# Patient Record
Sex: Male | Born: 1975 | Race: White | Hispanic: No | Marital: Married | Smoking: Never smoker
Health system: Southern US, Community
[De-identification: ages and names within clinical notes are randomized; demographics above are authoritative.]

## PROBLEM LIST (undated history)

## (undated) ENCOUNTER — Ambulatory Visit (HOSPITAL_COMMUNITY): Source: Home / Self Care

## (undated) DIAGNOSIS — B009 Herpesviral infection, unspecified: Secondary | ICD-10-CM

## (undated) DIAGNOSIS — M545 Low back pain, unspecified: Secondary | ICD-10-CM

## (undated) DIAGNOSIS — S82899A Other fracture of unspecified lower leg, initial encounter for closed fracture: Secondary | ICD-10-CM

## (undated) HISTORY — DX: Low back pain, unspecified: M54.50

## (undated) HISTORY — DX: Low back pain: M54.5

## (undated) HISTORY — DX: Herpesviral infection, unspecified: B00.9

## (undated) HISTORY — DX: Other fracture of unspecified lower leg, initial encounter for closed fracture: S82.899A

---

## 2005-07-13 ENCOUNTER — Encounter: Admission: RE | Admit: 2005-07-13 | Discharge: 2005-07-13 | Payer: Self-pay | Admitting: Family Medicine

## 2005-10-14 DIAGNOSIS — B009 Herpesviral infection, unspecified: Secondary | ICD-10-CM

## 2005-10-14 HISTORY — DX: Herpesviral infection, unspecified: B00.9

## 2009-03-04 ENCOUNTER — Emergency Department (HOSPITAL_COMMUNITY): Admission: EM | Admit: 2009-03-04 | Discharge: 2009-03-04 | Payer: Self-pay | Admitting: Emergency Medicine

## 2010-09-21 IMAGING — CR DG KNEE COMPLETE 4+V*R*
4 series · 4 of 4 positions shown · non-contrast
Comparison: None.

CLINICAL DATA: Fall, ankle fracture, knee pain

RIGHT KNEE - COMPLETE 4+ VIEW

[t knee ap right]
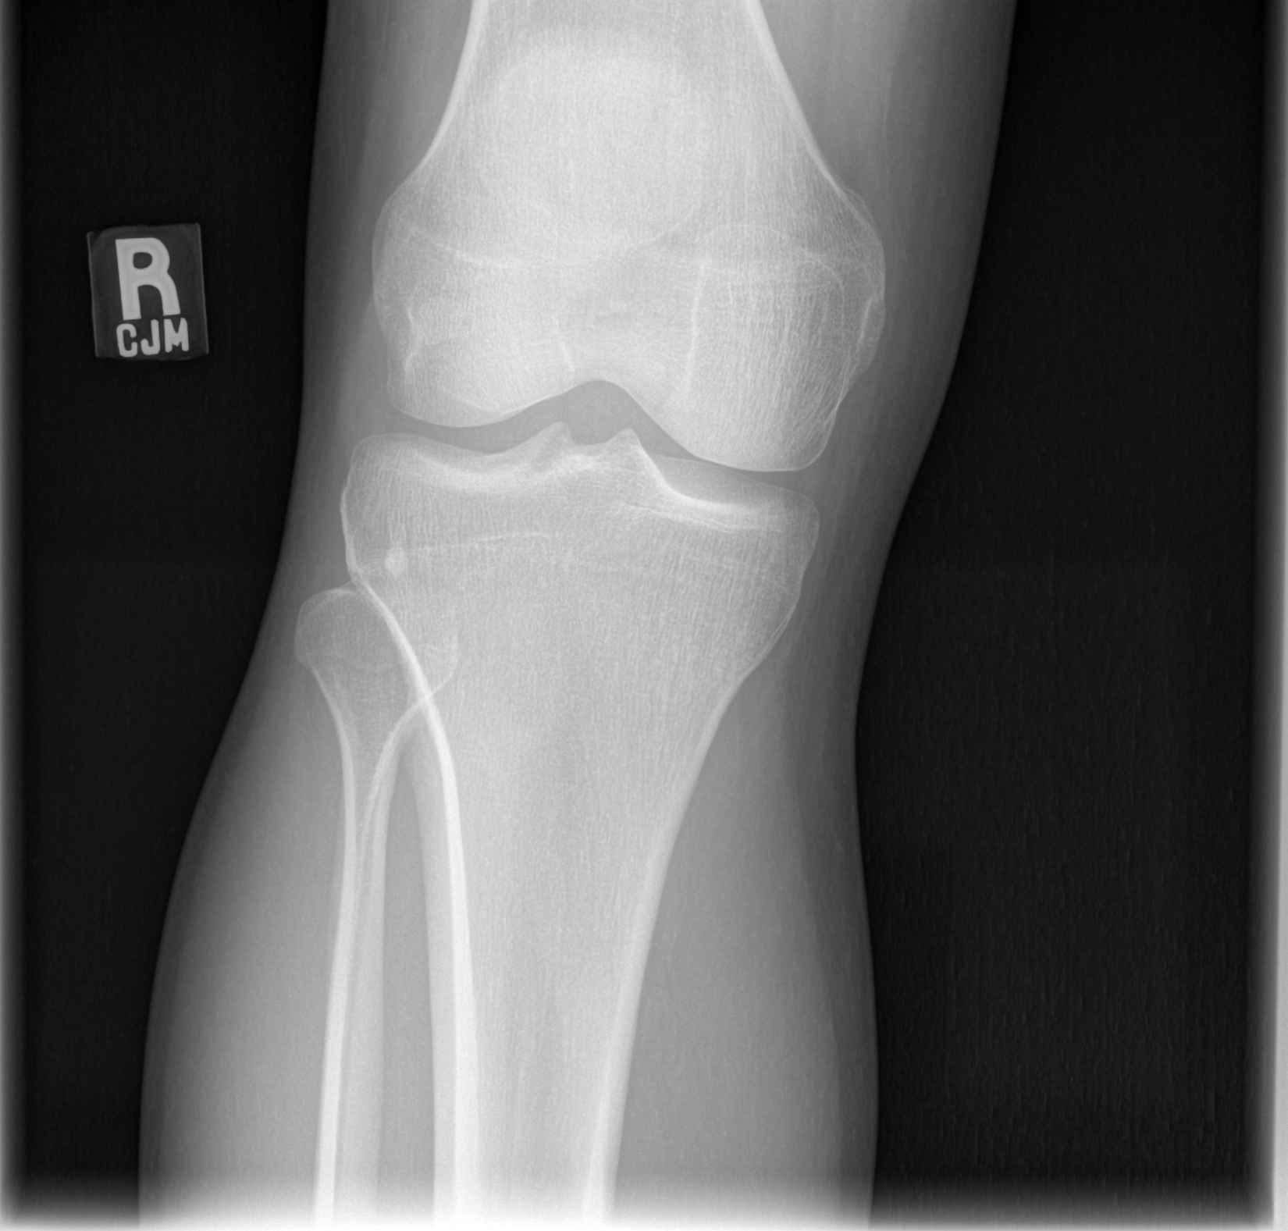

[t knee oblique right (1 of 2)]
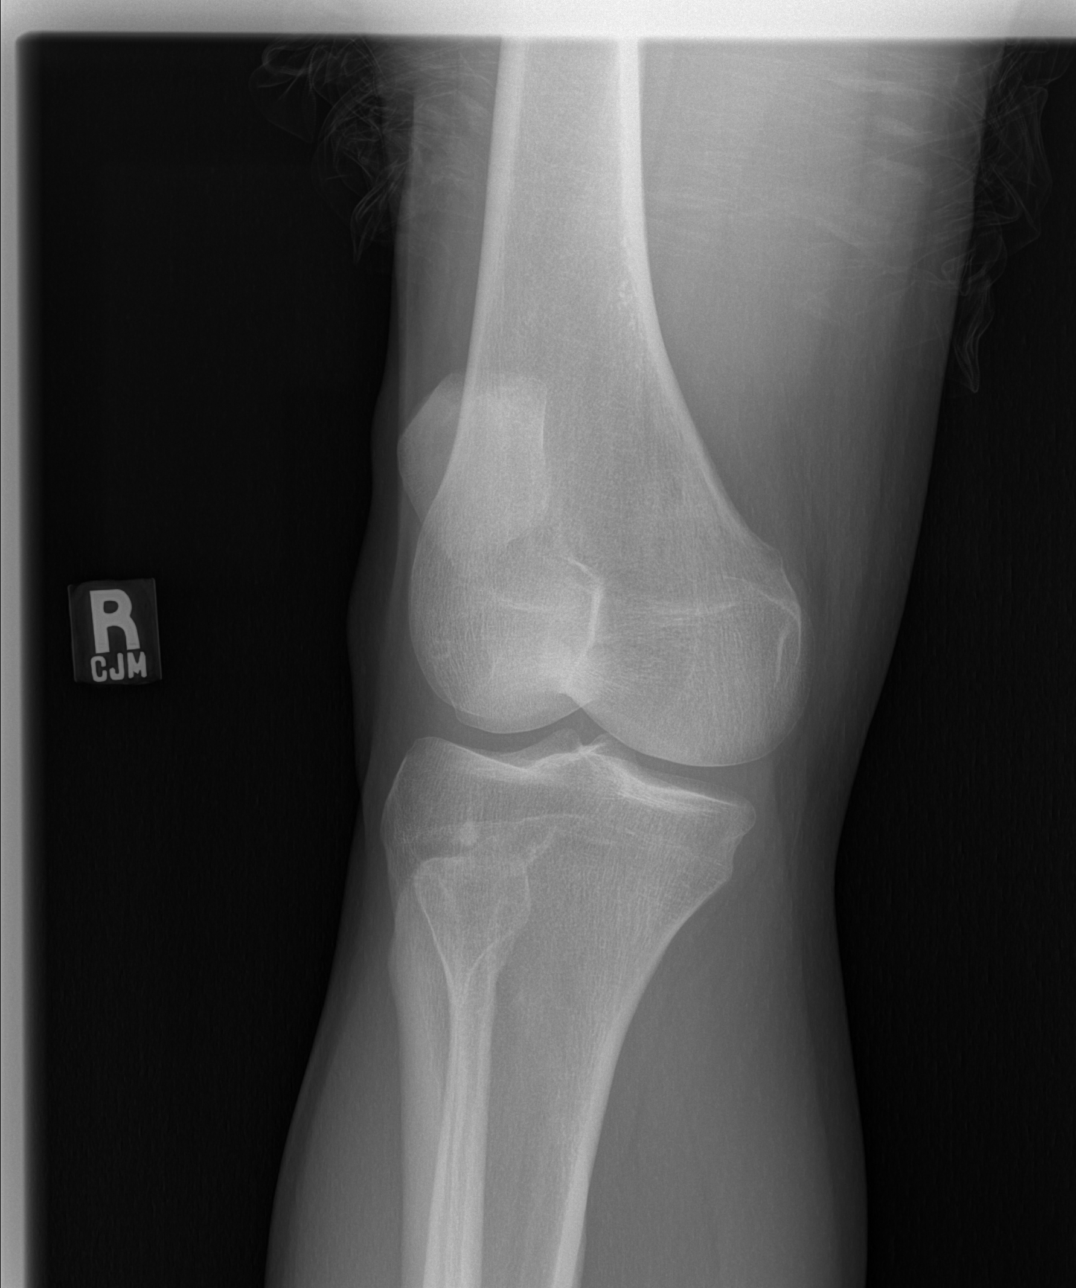

[t knee oblique right (2 of 2)]
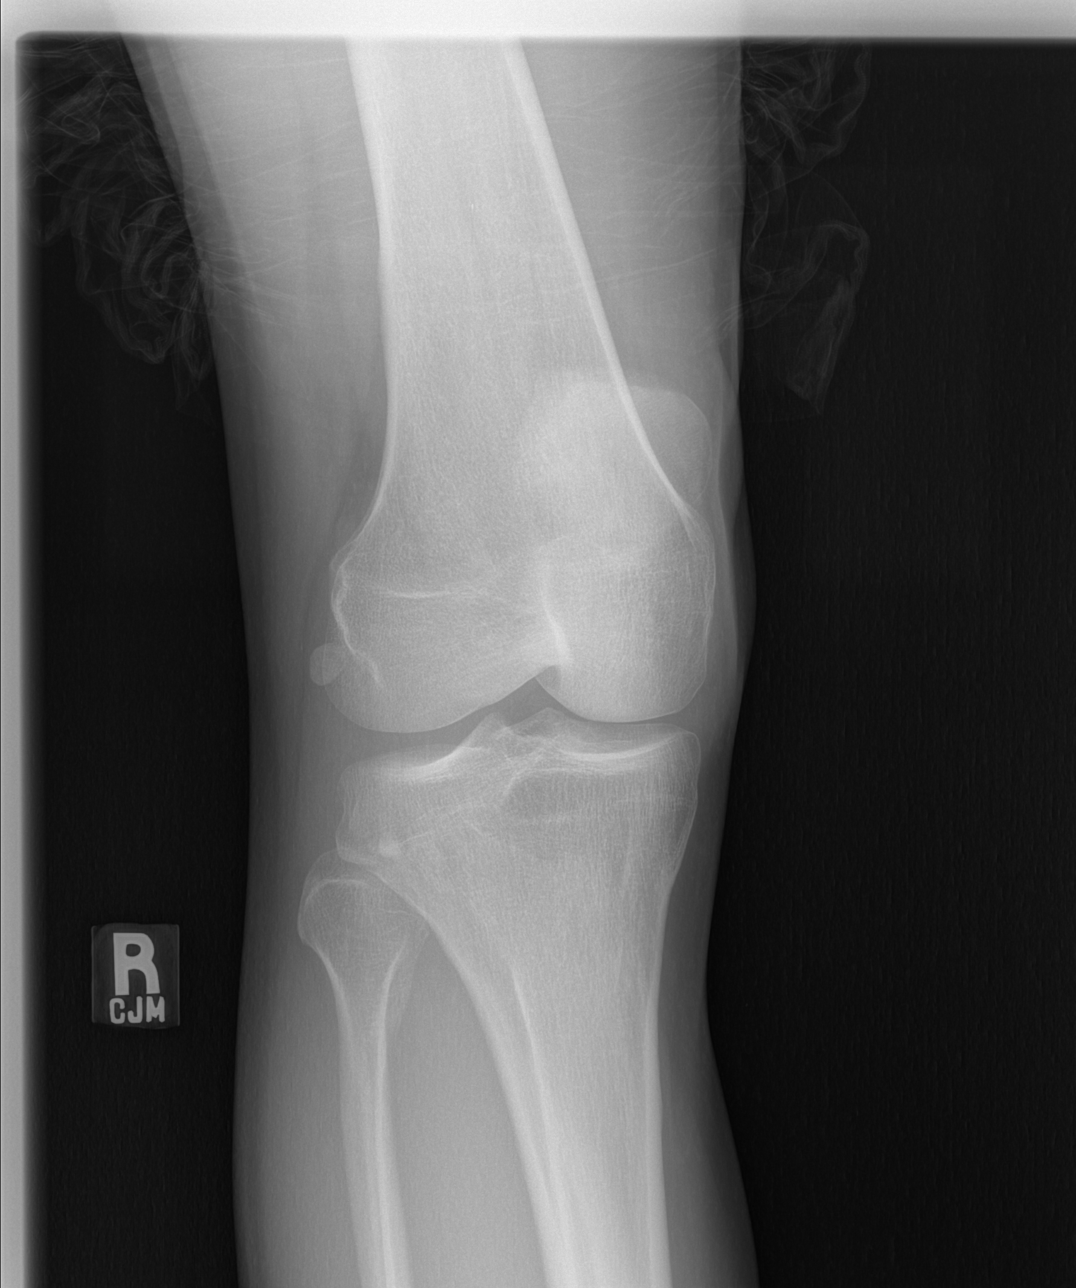

[t knee lat right]
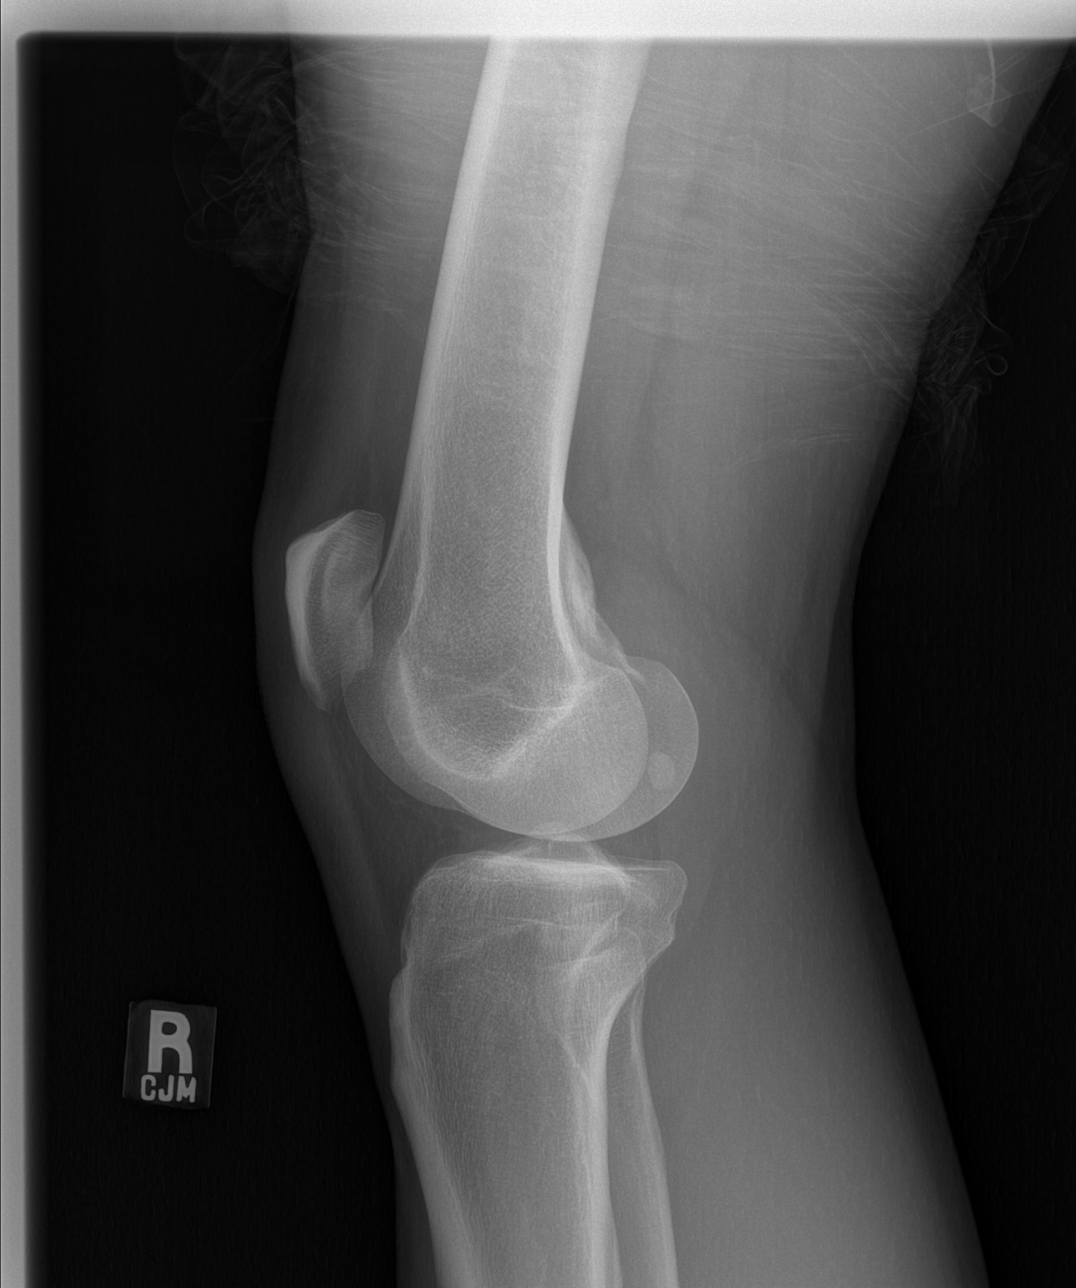

[4 of 4 positions shown; findings below may reference images not displayed]

FINDINGS: Normal alignment.  No displaced fracture or large joint
effusion.  Preserved joint spaces.  No arthritic change.  Small
sclerotic bone island in the right lateral tibia plateau.
IMPRESSION: No acute bony abnormality.

## 2010-10-26 ENCOUNTER — Ambulatory Visit (INDEPENDENT_AMBULATORY_CARE_PROVIDER_SITE_OTHER): Payer: 59 | Admitting: Physician Assistant

## 2010-10-26 DIAGNOSIS — M545 Low back pain, unspecified: Secondary | ICD-10-CM

## 2010-10-26 DIAGNOSIS — T148XXA Other injury of unspecified body region, initial encounter: Secondary | ICD-10-CM

## 2010-12-10 ENCOUNTER — Ambulatory Visit (INDEPENDENT_AMBULATORY_CARE_PROVIDER_SITE_OTHER): Payer: 59 | Admitting: Family Medicine

## 2010-12-10 DIAGNOSIS — J069 Acute upper respiratory infection, unspecified: Secondary | ICD-10-CM

## 2011-02-01 ENCOUNTER — Other Ambulatory Visit: Payer: Self-pay | Admitting: Occupational Medicine

## 2011-02-01 ENCOUNTER — Ambulatory Visit
Admission: RE | Admit: 2011-02-01 | Discharge: 2011-02-01 | Disposition: A | Discharge status: Home / Self Care | Payer: 59 | Source: Ambulatory Visit | Attending: Occupational Medicine | Admitting: Occupational Medicine

## 2011-02-01 DIAGNOSIS — M79673 Pain in unspecified foot: Secondary | ICD-10-CM

## 2011-02-24 ENCOUNTER — Encounter: Payer: Self-pay | Admitting: *Deleted

## 2011-03-01 ENCOUNTER — Ambulatory Visit (INDEPENDENT_AMBULATORY_CARE_PROVIDER_SITE_OTHER): Payer: 59 | Admitting: Family Medicine

## 2011-03-01 ENCOUNTER — Encounter: Payer: Self-pay | Admitting: Family Medicine

## 2011-03-01 VITALS — BP 134/70 | HR 80 | Ht 70.0 in | Wt 184.0 lb

## 2011-03-01 DIAGNOSIS — M79609 Pain in unspecified limb: Secondary | ICD-10-CM

## 2011-03-01 DIAGNOSIS — M79672 Pain in left foot: Secondary | ICD-10-CM

## 2011-03-01 NOTE — Progress Notes (Signed)
  Subjective:    Patient ID: James Bond, male    DOB: Jan 14, 1976, 35 y.o.   MRN: [id number]  HPI On May 20th, while training, injured arch of left foot.  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  Treated with hard-soled shoe and anti-inflammatories.  Called last week to make appointment because he was having some ongoing discomfort. He had been cleared to go back to work, but wanted my opinion regarding his activities.  He plans to run the [employer]	[place] marathon in October.  Complaining of stiffness upon awakening in the morning (arch/bottom of foot), occasionally pain spreads to the top of the foot.  Denies heel pain.  Seems to work itself out, and get better as the day progresses.  Some soreness persists/develops if on his feet all day and very active.  Symptoms were worse last week when he called to make the appointment.  He has been resting it more, hasn't been running, but has been walking 3-5 miles every evening and not having increased pain   Review of Systems No numbness, tingling, swelling, rash, fever, GI complaints or other concerns    Objective:   Physical Exam BP 134/70  Pulse 80  Ht 5\' 10"  (1.778 m)  Wt 184 lb (83.462 kg)  BMI 26.40 kg/m2 Pleasant, well developed male in no distress Extremities:  L foot--2+ pulses.  No clubbing or cyanosis, no edema.  Area of his discomfort is in his arch. He is not tender anywhere along the arch/plantar fascia or heel.  No reproducible tenderness at all       Assessment & Plan:   1. Foot pain, left    It sounds as though he strained his arch/plantar fascia, but that with light duty and rest, symptoms have gradually improved. Currently his exam is entirely normal.  He was instructed on stretching exercises for the arch (recommend trying golfball).  Gradually increase activity next week, starting with short runs, and increasing as tolerated.  If he has recurrent pain, back down on activities, prn NSAID, ice  after activity.  F/U prn

## 2011-07-29 ENCOUNTER — Ambulatory Visit (INDEPENDENT_AMBULATORY_CARE_PROVIDER_SITE_OTHER): Payer: 59 | Admitting: Family Medicine

## 2011-07-29 ENCOUNTER — Encounter: Payer: Self-pay | Admitting: Family Medicine

## 2011-07-29 VITALS — BP 108/70 | HR 64 | Ht 70.0 in | Wt 184.0 lb

## 2011-07-29 DIAGNOSIS — L259 Unspecified contact dermatitis, unspecified cause: Secondary | ICD-10-CM

## 2011-07-29 DIAGNOSIS — Z23 Encounter for immunization: Secondary | ICD-10-CM

## 2011-07-29 DIAGNOSIS — L309 Dermatitis, unspecified: Secondary | ICD-10-CM

## 2011-07-29 DIAGNOSIS — M7650 Patellar tendinitis, unspecified knee: Secondary | ICD-10-CM

## 2011-07-29 MED ORDER — TRIAMCINOLONE ACETONIDE 0.1 % EX CREA: TOPICAL_CREAM | Freq: Two times a day (BID) | CUTANEOUS | Status: AC

## 2011-07-29 MED ORDER — DICLOFENAC SODIUM 75 MG PO TBEC: 75.0000 mg | DELAYED_RELEASE_TABLET | Freq: Two times a day (BID) | ORAL | Status: DC

## 2011-07-29 NOTE — Patient Instructions (Signed)
Use steroid cream twice daily, sparingly, to affected areas of skin.  If the rash on neck is getting worse or spreading, STOP using it, and change to lotrimin cream.  You may need to use this twice daily for up to 2-3 weeks  Patellar Tendinitis, Jumper's Knee with Rehab Tendinitis is inflammation of a tendon. Tendonitis of the tendon below the kneecap (patella) is known as patellar tendonitis. Patellar tendonitis is a common cause of pain below the kneecap (infrapatella). Patellar tendonitis may involve a tear (strain) in the ligament. Strains are classified into three categories. Grade 1 strains cause pain, but the tendon is not lengthened. Grade 2 strains include a lengthened ligament, due to the ligament being stretched or partially ruptured. With grade 2 strains there is still function, although function may be decreased. Grade 3 strains involve a complete tear of the tendon or muscle, and function is usually impaired. Patellar tendon strains are usually grade 1 or 2.  SYMPTOMS   Pain, tenderness, swelling, warmth, or redness over the patellar tendon (just below the kneecap).   Pain and loss of strength (sometimes), with forcefully straightening the knee (especially when jumping or rising from a seated or squatting position), or bending the knee completely (squatting or kneeling).   Crackling sound (crepitation) when the tendon is moved or touched.  CAUSES  Patellar tendonitis is caused by injury to the patellar tendon. The inflammation is the body's healing response. Common causes of injury include:  Stress from a sudden increase in intensity, frequency, or duration of training.   Overuse of the quadriceps thigh muscles and patellar tendon.   Direct hit (trauma) to the knee or patellar tendon.  RISK INCREASES WITH:  Sports that require sudden, explosive thigh muscle (quadriceps) contraction, such as jumping, quick starts, or kicking.   Running sports, especially running down hills.    Poor strength and flexibility of the thigh and knee.   Flat feet.  PREVENTION  Warm up and stretch properly before activity.   Allow for adequate recovery between workouts.   Maintain physical fitness:   Strength, flexibility, and endurance.   Cardiovascular fitness.   Protect the knee joint with taping, protective strapping, bracing, or elastic compression bandage.   Wear arch supports (orthotics).  PROGNOSIS  If treated properly, patellar tendonitis usually heals within 6 weeks.  RELATED COMPLICATIONS   Longer healing time, if not properly treated or if not given enough time to heal.   Recurring symptoms, if activity is resumed too soon, with overuse, with a direct blow, or when using poor technique.   If untreated, tendon rupture requiring surgery.  TREATMENT Treatment first involves the use of ice and medicine, to reduce pain and inflammation. The use of strengthening and stretching exercises may help reduce pain with activity. These exercises may be performed at home or with a therapist. Serious cases of tendonitis may require restraining the knee for 10 to 14 days, to prevent stress on the tendon and to promote healing. Crutches may be used (uncommon) until you can walk without a limp. For cases in which non-surgical treatment is unsuccessful, surgery may be advised, to remove the inflamed tendon lining (sheath). Surgery is rare, and is only advised after at least 6 months of non-surgical treatment. MEDICATION   If pain medicine is needed, nonsteroidal anti-inflammatory medicines (aspirin and ibuprofen), or other minor pain relievers (acetaminophen), are often advised.   Do not take pain medicine for 7 days before surgery.   Prescription pain relievers may be given,  if your caregiver thinks they are needed. Use only as directed and only as much as you need.  HEAT AND COLD  Cold treatment (icing) should be applied for 10 to 15 minutes every 2 to 3 hours for inflammation  and pain, and immediately after activity that aggravates your symptoms. Use ice packs or an ice massage.   Heat treatment may be used before performing stretching and strengthening activities prescribed by your caregiver, physical therapist, or athletic trainer. Use a heat pack or a warm water soak.  SEEK MEDICAL CARE IF:  Symptoms get worse or do not improve in 2 weeks, despite treatment.   New, unexplained symptoms develop. (Drugs used in treatment may produce side effects.)  EXERCISES RANGE OF MOTION (ROM) AND STRETCHING EXERCISES - Patellar Tendinitis (Jumper's Knee) These are some of the initial exercises with which you may start your rehabilitation program, until you see your caregiver again or until your symptoms are resolved. Remember:   Flexible tissue is more tolerant of the stresses placed on it during activities.   Each stretch should be held for 20 to 30 seconds.   A gentle stretching sensation should be felt.  STRETCH - Hamstrings, Supine  Lie on your back. Loop a belt or towel over the ball of your right / left foot.   Straighten your right / left knee and slowly pull on the belt to raise your leg. Do not allow the right / left knee to bend. Keep your opposite leg flat on the floor.   Raise the leg until you feel a gentle stretch behind your right / left knee or thigh. Hold this position for __________ seconds.  Repeat __________ times. Complete this stretch __________ times per day.  STRETCH - Hamstrings, Doorway  Lie on your back with your right / left leg extended and resting on the wall, and the opposite leg flat on the ground through the door. At first, position your bottom farther away from the wall.   Keep your right / left knee straight. If you feel a stretch behind your knee or thigh, hold this position for __________ seconds.   If you do not feel a stretch, scoot your bottom closer to the door, and hold __________ seconds.  Repeat __________ times. Complete  this stretch __________ times per day.  STRETCH - Hamstrings, Standing  Stand or sit and extend your right / left leg, placing your foot on a chair or foot stool.   Keep a slight arch in your low back and your hips straight forward.   Lead with your chest and lean forward at the waist until you feel a gentle stretch in the back of your right / left knee or thigh. (When done correctly, this exercise requires leaning only a small distance.)   Hold this position for __________ seconds.  Repeat __________ times. Complete this stretch __________ times per day. STRETCH - Adductors, Lunge  While standing, spread your legs, with your right / left leg behind you.   Lean away from your right / left leg by bending your opposite knee. You may rest your hands on your thigh for balance.   You should feel a stretch in your right / left inner thigh. Hold for __________ seconds.  Repeat __________ times. Complete this exercise __________ times per day.  STRENGTHENING EXERCISES - Patellar Tendinitis (Jumper's Knee) These exercises may help you when beginning to rehabilitate your injury. They may resolve your symptoms with or without further involvement from your physician, physical  therapist or athletic trainer. While completing these exercises, remember:   Muscles can gain both the endurance and the strength needed for everyday activities through controlled exercises.   Complete these exercises as instructed by your physician, physical therapist or athletic trainer. Increase the resistance and repetitions only as guided by your caregiver.  STRENGTH - Quadriceps, Isometrics  Lie on your back with your right / left leg extended and your opposite knee bent.   Gradually tense the muscles in the front of your right / left thigh. You should see either your kneecap slide up toward your hip or increased dimpling just above the knee. This motion will push the back of the knee down toward the floor, mat, or bed on  which you are lying.   Hold the muscle as tight as you can, without increasing your pain, for __________ seconds.   Relax the muscles slowly and completely in between each repetition.  Repeat __________ times. Complete this exercise __________ times per day.  STRENGTH - Quadriceps, Short Arcs  Lie on your back. Place a __________ inch towel roll under your right / left knee, so that the knee bends slightly.   Raise only your lower leg by tightening the muscles in the front of your thigh. Do not allow your thigh to rise.   Hold this position for __________ seconds.  Repeat __________ times. Complete this exercise __________ times per day.  OPTIONAL ANKLE WEIGHTS: Begin with ____________________, but DO NOT exceed ____________________. Increase in 1 pound/ 0.5 kilogram increments. STRENGTH - Quadriceps, Straight Leg Raises  Quality counts! Watch for signs that the quadriceps muscle is working, to be sure you are strengthening the correct muscles and not "cheating" by substituting with healthier muscles.  Lay on your back with your right / left leg extended and your opposite knee bent.   Tense the muscles in the front of your right / left thigh. You should see either your kneecap slide up or increased dimpling just above the knee. Your thigh may even shake a bit.   Tighten these muscles even more and raise your leg 4 to 6 inches off the floor. Hold for __________ seconds.   Keeping these muscles tense, lower your leg.   Relax the muscles slowly and completely between each repetition.  Repeat __________ times. Complete this exercise __________ times per day.  STRENGTH - Quadriceps, Squats  Stand in a door frame so that your feet and knees are in line with the frame.   Use your hands for balance, not support, on the frame.   Slowly lower your weight, bending at the hips and knees. Keep your lower legs upright so that they are parallel with the door frame. Squat only within the range that  does not increase your knee pain. Never let your hips drop below your knees.   Slowly return upright, pushing with your legs, not pulling with your hands.  Repeat __________ times. Complete this exercise __________ times per day.  STRENGTH - Quadriceps, Step-Downs  Stand on the edge of a step stool or stair. Be prepared to use a countertop or wall for balance, if needed.   Keeping your right / left knee directly over the middle of your foot, slowly touch your opposite heel to the floor or lower step. Do not go all the way to the floor if your knee pain increases, just go as far as you can without increased discomfort. Use your right / left leg muscles, not gravity to lower your body  weight.   Slowly push your body weight back up to the starting position,  Repeat __________ times. Complete this exercise __________ times per day.  Document Released: 08/30/2005 Document Revised: 05/12/2011 Document Reviewed: 12/12/2008 [facility] Patient Information 2012 [place], [place].

## 2011-07-29 NOTE — Progress Notes (Signed)
CC:  Knee pain.  Seen for knee injury at [facility] 9/12-treated but still having issues, thinks he re-injured. Patient also thinks he has jock itch  HPI: He started having R knee pain while training for the [employer].  He was told that the patellar tendon was frayed, and that he needed to back down on his running mileage.  He was rx'd Diclofenac 75mg  daily.  He took a break from running, and symptoms completely resolved.  Was able to run the 10K at the [employer]	[place] and he was fine, no pain.  A week later he did the Zombie run, which is a 2 mile run, but involved a lot of sprinting, and knee pain recurred.  He continues to walk 5 miles/day with his wife when not working, feels just a little sore afterwards. He has pain with kneeling, and squatting, which are activities that he does at work.  Had some swelling at the knee initially, but that resolved.  He intermittently has had problems with jock itch.  He gets it every year.  OTC meds work sometimes. Once got Rx for Lotrisone which was effective.  Now he feels like he has the same type of rash, itching on his neck where he shaves.  Only very mildly itchy.  Used OTC lotrimin for 2-3 days, didn't seem to help--maybe less bright and noticeable, but seems to be spreading, especially on L side. He denies using same razor on both areas.  Frequent fungal infections have been related to the fact that he perspires a lot at work due to the gear he has to wear.   Past Medical History  Diagnosis Date  . 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 LBP (low back pain) h/o 11/2008,09/2009  . Fx ankle right(02/2009) (boot and pt)    No past surgical history on file.  History   Social History  . Marital Status: Single    Spouse Name: N/A    Number of Children: N/A  . Years of Education: N/A   Occupational History  . Not on file.   Social History Main Topics  . Smoking status: Never Smoker   . Smokeless tobacco: Never Used  . Alcohol Use: Yes     2-3 beers 3 times per week  . Drug Use: No  . Sexually Active: Not on file   Other Topics Concern  . Not on file   Social History Narrative  . No narrative on file    Family History  Problem Relation Age of Onset  . Hypertension Mother   . Arthritis Mother     due to MVA  . Asthma Mother   . Allergy (severe) Mother   . Crohn's disease Sister   . Asthma Brother   . Cancer Maternal Grandmother     melanoma   No current outpatient prescriptions on file prior to visit.   No Known Allergies  ROS:  Denies fevers, URI symptoms, other joint pains, GI symptoms, or other concerns  PHYSICAL EXAM: BP 108/70  Pulse 64  Ht 5\' 10"  (1.778 m)  Wt 184 lb (83.462 kg)  BMI 26.40 kg/m2 Well developed pleasant male in no distress Skin: very mild erythema, slightly dry appearing to lateral aspects of neck.  Spreading inferiorly and laterally on the left side was an area that was somewhat circular.  From an angle, the edges appeared more raised than the center, but erythema (very mild) was the same throughout.   Neck: no lymphadenopathy or  thyromegaly Knees:  FROM, some crepitus on L initially that disappeared with continued movement.  No effusion, soft tissue swelling or warmth. Tender at distal patellar tendon on R at the tibeal plateau.  Negative drawer, no pain with patellar compression.  Remainder of knee exam normal  ASSESSMENT/PLAN:  1. Need for prophylactic vaccination and inoculation against influenza  Flu vaccine greater than or equal to 3yo preservative free IM  2. Dermatitis  triamcinolone cream (KENALOG) 0.1 %  3. Patellar tendonitis  diclofenac (VOLTAREN) 75 MG EC tablet    Dermatitis--doesn't appear to be consistent with fungal, suspect contact.  Treat with TAC 0.1% BID for up to 10-14 days.  If getting WORSE with steroid treatment, then consider possibility of fungal, and treat with clotrimazole for up to 2-3 weeks.  R knee patellar tendonitis.  Refill diclofenac.   Rest

## 2011-10-12 ENCOUNTER — Ambulatory Visit (INDEPENDENT_AMBULATORY_CARE_PROVIDER_SITE_OTHER): Payer: 59 | Admitting: Sports Medicine

## 2011-10-12 VITALS — BP 112/60 | Ht 70.0 in | Wt 187.0 lb

## 2011-10-12 DIAGNOSIS — M7650 Patellar tendinitis, unspecified knee: Secondary | ICD-10-CM

## 2011-10-12 DIAGNOSIS — M25569 Pain in unspecified knee: Secondary | ICD-10-CM

## 2011-10-12 MED ORDER — NITROGLYCERIN 0.2 MG/HR TD PT24: MEDICATED_PATCH | TRANSDERMAL | Status: DC

## 2011-10-12 NOTE — Progress Notes (Signed)
  Subjective:    Patient ID: James Bond, male    DOB: 04/17/1976, 36 y.o.   MRN: [id number]  HPI  Pt presents with right knee pain that has been going on for a year.  Pt says last spring he was training for a Marathon and his right knee started hurting.  He was seen and a clinic, who got x-rays, and told him to rest his knee, ice it, and take ibuprofen.  He ended up running a 10 k instead of a Marathon, after resting, and had no pain.    In November, he did the zombie run here in [place], and there was a lot of sprinting and stopping.  He says his knee started hurting badly after that run.   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  No defininite injury, no swelling.    Review of Systems Pertinent items in HPI.     Objective:   Physical Exam BP 112/60  Ht 5\' 10"  (1.778 m)  Wt 187 lb (84.823 kg)  BMI 26.83 kg/m2 General appearance: alert, cooperative and no distress Right Knee: Normal to inspection with no erythema or effusion or obvious bony abnormalities. Palpation normal with no warmth or joint line tenderness or patellar tenderness or condyle tenderness. ROM normal in flexion and extension and lower leg rotation. Crepitus with ROM Ligaments with solid consistent endpoints including ACL, PCL, LCL, MCL. Negative Mcmurray's and provocative meniscal tests. Non painful patellar compression, but some crepitus with compression.  Patellar tendon slightly painful to palpation. and quadriceps unremarkable. Hamstring and quadriceps strength is normal.  MSK [place]: Pt has hypoechoic area in right patellar tendon, as well as hypoechoic area below the tendon at the insertion on patella, and has calcifications and hypoechoic area at insertion on tibial tuberosity.  Hypoechoic change in the proximal tendon is seen both longitudinal and transverse scan and there is some calcification of the transverse scan that is in the middle of the defect. There seems to be a  pseudo-bursa noted on both scans. Distal tendon insertion there is significant dorsal swelling.     Assessment & Plan:

## 2011-10-12 NOTE — Assessment & Plan Note (Signed)
Pt with patellar tendon split as cause of knee pain, however, he also has poor running gait that may contribute to his symptoms.

## 2011-10-12 NOTE — Assessment & Plan Note (Addendum)
With small spit visualized in tendon.  Rx nitro patches, gave HEP (see pt instructions).    patellar helix strap.  Reck 4 wks

## 2011-10-12 NOTE — Patient Instructions (Addendum)
You have a split in your patellar tendon, we will try a combination of medication and exercises:  Home exercises:  [employer] with [employer] drills 50-75 yards.  Squats: heels on 3-4 inch raise, squat with about 40 lbs to 45 degrees, 3 sets of 15, then on step at 45 degrees.  Try to make sure your foot is straight while doing all of these.  Use floors or areas that have straight lines to exercise when you can.  Please use 1/4 of a nitroglycerin patch on your knee, at the most tender point daily.  Please try doing exercise bike and limit running.   Please come back and see [place] in 4 weeks.

## 2011-11-09 ENCOUNTER — Ambulatory Visit (INDEPENDENT_AMBULATORY_CARE_PROVIDER_SITE_OTHER): Payer: 59 | Admitting: Sports Medicine

## 2011-11-09 VITALS — BP 130/78

## 2011-11-09 DIAGNOSIS — M7650 Patellar tendinitis, unspecified knee: Secondary | ICD-10-CM

## 2011-11-09 DIAGNOSIS — M25569 Pain in unspecified knee: Secondary | ICD-10-CM
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
## 2011-11-09 NOTE — Patient Instructions (Addendum)
Increase to 1/2 nitroglycerin patch daily unless headaches get worse.  If headaches do get worse please decrease back to 1/4 patch daily.  Continue doing 1/2 squats with heels elevated- increase weights that you are holding until you feel MILD pain only  Use knee strap when you are being active  Ice knees at the end of each day  Use anti-inflammatory medications as needed   Please follow up in 6 weeks  Thank you for seeing [place] today!
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
## 2011-11-09 NOTE — Assessment & Plan Note (Signed)
This looks improved on scanning as to the tendon intself/  No doppler activity though  [place] does show increased bursal swelling distally that I suspect is from ladder climbing  Increase NTG to 1/2 patch for  Patellar tendon  Restart pf half squats and push these to level of mild pain  Reck 6 wks

## 2011-11-12 ENCOUNTER — Telehealth: Payer: Self-pay | Admitting: Internal Medicine

## 2011-11-12 DIAGNOSIS — B001 Herpesviral vesicular dermatitis: Secondary | ICD-10-CM

## 2011-11-12 MED ORDER — VALACYCLOVIR HCL 1 G PO TABS: ORAL_TABLET | ORAL | Status: DC

## 2011-11-12 NOTE — Telephone Encounter (Signed)
Pt informed word for word  

## 2011-11-12 NOTE — Telephone Encounter (Signed)
Advise pt--Rx for valtrex to treat cold sore was sent to pharmacy. We didn't have med on med list.  Confirm that we are treating cold sore (different dose/recommendations if is genital herpes). Advise of proper way to to take--2000 mg (2 tabs) at earliest onset of cold sore), then repeat another 2 tabs 12 hours later.  So, a total of 4 tablets for this episode. I sent in rx for #20 so he will have leftover for future episodes

## 2011-11-15 ENCOUNTER — Ambulatory Visit: Payer: Self-pay | Admitting: Sports Medicine

## 2011-12-20 ENCOUNTER — Encounter: Payer: Self-pay | Admitting: Sports Medicine

## 2011-12-20 ENCOUNTER — Ambulatory Visit (INDEPENDENT_AMBULATORY_CARE_PROVIDER_SITE_OTHER): Payer: 59 | Admitting: Sports Medicine

## 2011-12-20 VITALS — BP 131/80 | HR 64

## 2011-12-20 DIAGNOSIS — M7650 Patellar tendinitis, unspecified knee: Secondary | ICD-10-CM

## 2011-12-20 DIAGNOSIS — M76899 Other specified enthesopathies of unspecified lower limb, excluding foot: Secondary | ICD-10-CM

## 2011-12-20 DIAGNOSIS — M7050 Other bursitis of knee, unspecified knee: Secondary | ICD-10-CM | POA: Insufficient documentation

## 2011-12-20 NOTE — Progress Notes (Signed)
Patient ID: DESMOND SZABO, male   DOB: [birth year], 36 y.o.   MRN: [id number] The patient presents today for followup on his right knee patellar tendinitis. He reports that he is using a nitroglycerin patch daily and is noticing significant improvement. He estimates that he has a 60% improvement. 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 He reports that climbing stairs or bladder still causes pain deep within the knee. He also has been noticing recently that kneeling seems to make his he somewhat worse. In addition to his nitroglycerin patches he is also taking Aleve on a daily basis.  Objective: Gen: Well developed male, no distress Right Knee: Full flexion/extension without pain.  No significant swelling.  MCL/LCL are intact on exam.  Negative McMurray's.  No significant discomfort on palpation over the patellar tendon or joint line.  MSK [place]: Patellar tendon is approximately 90% healed with no inappropriate blood flow.  There is now a small amount of fluid in the space posterior to the distal patellar tendon but no evidence of tendon injury.  Assessment/Plan: Patellar tendon injury -- healing.  Continue currently therapy for an additional 5 weeks then see back.  OK to start light running.  Plan repeat US in 5 weeks. Bursitis: No specific therapy at this time, will monitor at next visit.

## 2011-12-20 NOTE — Patient Instructions (Signed)
Continue your nitro on a daily basis.  Come back to see [place] in 5 weeks. You can continue using your strap if you think it helps. Continue the squats like we have instructed.  You can start doing some easy running a couple of times per week as long as it isn't hurting much.

## 2011-12-20 NOTE — Assessment & Plan Note (Signed)
Cont to improve   We may be able to shorten his NTG tx to 14 weeks if he shows this much improvement on RTC

## 2011-12-20 NOTE — Assessment & Plan Note (Signed)
This is new and I suspect is post traumatic - banged knee at tibial tubercle Ice and follo

## 2012-01-19 ENCOUNTER — Ambulatory Visit (INDEPENDENT_AMBULATORY_CARE_PROVIDER_SITE_OTHER): Payer: 59 | Admitting: Family Medicine

## 2012-01-19 ENCOUNTER — Encounter: Payer: Self-pay | Admitting: Family Medicine

## 2012-01-19 VITALS — BP 120/80 | HR 68 | Temp 98.3°F | Ht 70.0 in | Wt 186.0 lb

## 2012-01-19 DIAGNOSIS — B354 Tinea corporis: Secondary | ICD-10-CM

## 2012-01-19 NOTE — Patient Instructions (Signed)
No evidence of enlarged lymph nodes.  Restart clotrimazole cream to left neck, twice daily for at least 2-3 weeks.  Warm compresses to tender areas of skin. If develops a pustule, continue with compresses and use antibacterial ointment.  Try and not shave as close, to allow hair to grow out of skin.  If these two areas on the skin get larger and more tender, any redness or drainage, call for antibiotic (Septra)  Consider seeing a dermatologist if skin concerns persist.  If the rash on the neck doesn't resolve with antifungals, must consider other etiology (ie granuloma annulare)

## 2012-01-19 NOTE — Progress Notes (Signed)
Chief complaint: swollen glands both sided of his neck x 2-3 weeks. No other symptoms. Would like to know if you could refill his nitro patch for his knee  HPI:  Swollen glands on both sides of his neck, tender.  Wife made him make appointment.  Has about 2 small lumps on each side of his chin/neck.  These started in November when he had rash as discussed at OV then.  Rash resolved with TAC cream, but 2 small lumps remained.  Then developed lumps on both sides of his neck--Initially though it was a pimple/acne cyst.  Swollen glands have gotten a bit worse, have gotten tender.  Denies any fevers, sore throat, allergy symptoms, cough. Had a cold sore a couple of weeks ago, that resolved.  Chart reviewed--under care of Dr. Darrick Penna for patellar bursitis and tendonitis. Plan is to repeat u/s soon (5 weeks from his last visit) and continue NTG treatment.  Review of med section shows Rx started 10/12/11 with 30 patches, 12 refills.  Past Medical History  Diagnosis Date  . 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 LBP (low back pain) h/o 11/2008,09/2009  . Fx ankle right(02/2009) (boot and pt)   History   Social History  . Marital Status: Single    Spouse Name: N/A    Number of Children: N/A  . Years of Education: N/A   Occupational History  . Not on file.   Social History Main Topics  . Smoking status: Never Smoker   . Smokeless tobacco: Never Used  . Alcohol Use: Yes     2-3 beers 3 times per week  . Drug Use: No  . Sexually Active: Not on file   Other Topics Concern  . Not on file   Social History Narrative  . No narrative on file   Current Outpatient Prescriptions on File Prior to Visit  Medication Sig Dispense Refill  . triamcinolone cream (KENALOG) 0.1 % Apply topically 2 (two) times daily. Apply sparingly to affected areas of skin  30 g  0  . nitroGLYCERIN (NITRODUR - DOSED IN MG/24 HR) 0.2 mg/hr 1/4 of a patch on skin (knee) daily  30 patch  12  . valACYclovir (VALTREX)  1000 MG tablet Take 2 tablets at onset of cold sore, and repeat in 12 hours (taking 4 tablets per episode of outbreak)  20 tablet  0   No Known Allergies  ROS:  No fevers, weight changes, URI symptoms, cough, shortness of breath, chest pain, GI complaints or other concerns except per HPI>  PHYSICAL EXAM: BP 120/80  Pulse 68  Temp 98.3 F (36.8 C)  Ht 5\' 10"  (1.778 m)  Wt 186 lb (84.369 kg)  BMI 26.69 kg/m2 Well developed, pleasant male in no distress HEENT:  PERRL, EOMI, conjunctiva clear.  TM's and EAC's normal.  OP clear--tonsils mildly enlarged, but no erythema or exudate. Moist mucus membranes. Nasal mucosa mildly edematous L>R.  Sinuses nontender Neck: no significant lymphadenopathy noted.  There are two painful areas that appear to be superficial in the subcutaneous area--more like cystic lesions.  No overlying erythema or drainage.  +tender, mild.  The two small lumps he was referring to are on L submandibular area--<1 mm in size, and clearly subcutaneous/in the skin.  Skin: There appears to be an area of erythema with serpiginous border, central clearing at L neck.  ASSESSMENT/PLAN:  1. Tinea corporis     No significant lymphadenopathy noted--patient reassured.  The tender lumps that are appreciated  appear to be in the skin, rather than lymph nodes, likely either ingrown hairs or small cysts.  There appears also to be a component of tinea corporis (that patient wasn't aware of; has h/o in past).  Restart clotrimazole cream to left neck, twice daily for at least 2-3 weeks.  Discussed differential diagnosis, including granuloma annulare--may need derm eval if not improving with treatment.  Warm compresses to tender areas of skin. If develops a pustule, continue with compresses and use antibacterial ointment.  Try and not shave as close, to allow hair to grow out of skin.  If these two areas on the skin get larger and more tender, any redness or drainage, call for antibiotic  (Septra).

## 2012-03-14 ENCOUNTER — Encounter: Payer: Self-pay | Admitting: Sports Medicine

## 2012-03-14 ENCOUNTER — Ambulatory Visit (INDEPENDENT_AMBULATORY_CARE_PROVIDER_SITE_OTHER): Payer: 59 | Admitting: Sports Medicine

## 2012-03-14 VITALS — BP 125/74 | HR 74 | Ht 70.0 in | Wt 187.0 lb

## 2012-03-14 DIAGNOSIS — M76899 Other specified enthesopathies of unspecified lower limb, excluding foot: Secondary | ICD-10-CM

## 2012-03-14 DIAGNOSIS — M7650 Patellar tendinitis, unspecified knee: Secondary | ICD-10-CM

## 2012-03-14 DIAGNOSIS — M25569 Pain in unspecified knee: Secondary | ICD-10-CM

## 2012-03-14 DIAGNOSIS — M7050 Other bursitis of knee, unspecified knee: Secondary | ICD-10-CM

## 2012-03-14 NOTE — Assessment & Plan Note (Signed)
I think continued pressure on knee keeps this swollen  Not very tender  Use compression sleeve to see if we can lessen swelling and particularly use this if working on knees or crawling  Resume some of exercises for quads

## 2012-03-14 NOTE — Progress Notes (Signed)
  Subjective:    Patient ID: James Bond, male    DOB: [birth year], 36 y.o.   MRN: [id number]  HPI  Pt presents to clinic for f/u of rt patellar tendonitis which is about 80% improved. He also has infrapatellar bursitis which is still causing him some discomfort. Stopped squats due to lower back pain. Using 1/2 NTG patch daily on rt knee. Running 3 miles 2 days per week without problems.  Has tenderness at tibial tubercle with pressing on it.  Feels that the bursitis may have come from working on knees  Review of Systems     Objective:   Physical Exam  Full extension bilat knees Slight tenderness at tibial tubercle on rt Good quad development bilat RT Knee: Normal to inspection with no erythema or effusion or obvious bony abnormalities. Palpation normal with no warmth or joint line tenderness or patellar tenderness or condyle tenderness. ROM normal in flexion and extension and lower leg rotation. Ligaments with solid consistent endpoints including ACL, PCL, LCL, MCL. Negative Mcmurray's and provocative meniscal tests. Non painful patellar compression. Patellar and quadriceps tendons unremarkable. Hamstring and quadriceps strength is normal.  MSK [place] There remains significant fluid in infrapatellar bursa This area shows increased doppler activity The proximal patellar tendon shows much less hypoechoic change On transview hypoechoic change is ~ 20% of tendon No abnormal doppler flow in this region       Assessment & Plan:

## 2012-03-14 NOTE — Assessment & Plan Note (Signed)
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

## 2012-03-14 NOTE — Patient Instructions (Addendum)
Continue using 1/2 nitroglycerin patch daily, but move this down closer to tibial tubercle  Use knee compression sleeve as tolerated for activity, but especially wear if you are going to be putting direct pressure on your knees, and 1-2 hours after activity.   Do some type of strengthening activity for quads- biking is good  Please follow up in 6 weeks  Thank you for seeing [place] today!

## 2012-03-22 ENCOUNTER — Encounter: Payer: Self-pay | Admitting: Family Medicine

## 2012-03-22 ENCOUNTER — Ambulatory Visit (INDEPENDENT_AMBULATORY_CARE_PROVIDER_SITE_OTHER): Payer: 59 | Admitting: Family Medicine

## 2012-03-22 VITALS — BP 116/78 | HR 72 | Ht 70.0 in | Wt 185.0 lb

## 2012-03-22 DIAGNOSIS — M25539 Pain in unspecified wrist: Secondary | ICD-10-CM

## 2012-03-22 DIAGNOSIS — M25531 Pain in right wrist: Secondary | ICD-10-CM | POA: Insufficient documentation

## 2012-03-22 NOTE — Progress Notes (Signed)
Chief Complaint  Patient presents with  . Wrist Pain    right wrist pain x 3 days. No known injury. Writing when he first experienced pain.   HPI: Intermittent sharp pains in R wrist, usually relieved by some ROM movements, occuring on and off through the year.  About a week ago felt a sharp pop/pain while he was writing. He has been writing a lot more recently, making out index cards to study for an upcoming exam in September.  A few days ago felt very tight around wrist, stiff, like there was a rubberband around the wrist.  Felt worse after a workout consisting of boxing, weights, pull-ups--was VERY stiff the next day (3 days ago).  Has remained painful, yesterday was the worst, but today feels a little better.   Most discomfort with wrist extension, and rotation-- ie unlocking doors.    Has taken Aleve (just 1 tablet yesterday morning), otherwise no treatments.  Past Medical History  Diagnosis Date  . 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 LBP (low back pain) h/o 11/2008,09/2009  . Fx ankle right(02/2009) (boot and pt)   History   Social History  . Marital Status: Single    Spouse Name: N/A    Number of Children: N/A  . Years of Education: N/A   Occupational History  . Not on file.   Social History Main Topics  . Smoking status: Never Smoker   . Smokeless tobacco: Never Used  . Alcohol Use: Yes     2-3 beers 3 times per week  . Drug Use: No  . Sexually Active: Not on file   Other Topics Concern  . Not on file   Social History Narrative  . No narrative on file    Current Outpatient Prescriptions on File Prior to Visit  Medication Sig Dispense Refill  . DISCONTD: nitroGLYCERIN (NITRODUR - DOSED IN MG/24 HR) 0.2 mg/hr 1/4 of a patch on skin (knee) daily  30 patch  12  . triamcinolone cream (KENALOG) 0.1 % Apply topically 2 (two) times daily. Apply sparingly to affected areas of skin  30 g  0  . valACYclovir (VALTREX) 1000 MG tablet Take 2 tablets at onset of  cold sore, and repeat in 12 hours (taking 4 tablets per episode of outbreak)  20 tablet  0   No Known Allergies  ROS:  Denies numbness/tingling/weakness.  Denies any radiation of pain, denies neck pain. Denies nausea, vomiting, abdominal pain, skin rash/lesions.  Still having some knee pain issues, most recently being told he had some bursitis  PHYSICAL EXAM: BP 116/78  Pulse 72  Ht 5\' 10"  (1.778 m)  Wt 185 lb (83.915 kg)  BMI 26.54 kg/m2 Well developed, pleasant male, in no distress Some pain with wrist extension, minimal discomfort and full range of motion with flexion.  Negative Finkelsteins.  No crepitus on exam.  No effusion or warmth.  nontender to bony palpation, no snuffbox tenderness 2+ pulse.  Strength 5/5, normal sensation.  Negative phalen and tinel signs  ASSESSMENT/PLAN: 1. Right wrist pain    Supportive measures with ice/heat/ range of motion exercises, REST Recommended OTC NSAIDs--discussed dosing, risks F/u prn

## 2012-03-22 NOTE — Patient Instructions (Signed)
Try heat (and/or ice) at least 2-3 times per day. Range of motion exercises to prevent stiffness. Avoid heavy or repetitive activities with R arm/wrist Use Aleve--up to 2 tablets twice daily with food for up to 10-14 days, if needed  Return if numbness, tingling, weakness, swelling, worsening pain or other problems develop.

## 2012-06-15 ENCOUNTER — Ambulatory Visit (INDEPENDENT_AMBULATORY_CARE_PROVIDER_SITE_OTHER): Payer: 59 | Admitting: Sports Medicine

## 2012-06-15 VITALS — BP 120/78 | Ht 70.0 in | Wt 185.0 lb

## 2012-06-15 DIAGNOSIS — M7650 Patellar tendinitis, unspecified knee: Secondary | ICD-10-CM

## 2012-06-15 DIAGNOSIS — M7050 Other bursitis of knee, unspecified knee: Secondary | ICD-10-CM

## 2012-06-15 DIAGNOSIS — M76899 Other specified enthesopathies of unspecified lower limb, excluding foot: Secondary | ICD-10-CM

## 2012-06-15 MED ORDER — NITROGLYCERIN 0.2 MG/HR TD PT24: MEDICATED_PATCH | TRANSDERMAL | Status: DC

## 2012-06-15 NOTE — Progress Notes (Signed)
  Subjective:    Patient ID: James Bond, male    DOB: [birth year], 36 y.o.   MRN: [id number]  HPI Mr. Chesmore is here today for follow up of right knee pain.  Reports that he is nearly back to normal; only has pain under the patella with activity and it is more of a discomfort than pain.  Also having some intermittent sharp lateral knee pain, only with weight bearing movements.  Discomfort is worse with stairs.  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  Was using nitroglycerin patch in the past, but has not been using it recently.  Activity mostly consists of running, walking and hiking.   Review of Systems     Objective:   Physical Exam Gen: alert, cooperative, NAD Right Knee: Inspection with no erythema or obvious bony abnormalities; small degree of infrapatellar swelling as compared to left. Palpation normal with no warmth, joint line tenderness, patellar tenderness, or condyle tenderness. ROM full in flexion and extension and lower leg rotation. Ligaments with solid consistent endpoints including ACL, PCL, LCL, MCL. Negative Mcmurray's. Non painful patellar compression. Patellar glide without crepitus. Patellar and quadriceps tendons unremarkable. Hamstring and quadriceps strength is normal.   Right knee ultrasound:  left patellar tendon with hypoechoic area in the mid tendon without increased doppler flow; infrapatellar bursa with improved hypoechogenicity when compared to previous ultrasound.      Assessment & Plan:  Right knee pain: Infrapatellar bursitis with mild patellar tendonopathy likely secondary to occupational activities. Renewed prescription for nitroglycerin patch to be used for 7-10 days when he has a flare.  Exercises given to work on balancing quad and hamstring strength.  Encouraged use of ice after activity. Follow up in 3-4 months.

## 2012-06-15 NOTE — Patient Instructions (Addendum)
Use 1/4 to 1/2 nitroglycerin patch over the tendon when it is sore for 7-10 days.  Please use ice after activity. Do half squats at least 3 times a week if tolerated or biking to work on balancing quad and hamstring strength. Use compression sleeve if likely to be kneeling. Follow up in 3-4 months.

## 2012-06-15 NOTE — Assessment & Plan Note (Signed)
Tears have resolved but he continues to get some intra-tendinous edema  Keep up exercises and icing

## 2012-06-15 NOTE — Assessment & Plan Note (Signed)
This is stable and somewhat milder than on last visit

## 2012-09-21 ENCOUNTER — Ambulatory Visit: Payer: Self-pay | Admitting: Sports Medicine

## 2012-10-03 ENCOUNTER — Ambulatory Visit (INDEPENDENT_AMBULATORY_CARE_PROVIDER_SITE_OTHER): Payer: 59 | Admitting: Sports Medicine

## 2012-10-03 VITALS — BP 120/78 | Ht 70.0 in | Wt 184.0 lb

## 2012-10-03 DIAGNOSIS — M25539 Pain in unspecified wrist: Secondary | ICD-10-CM

## 2012-10-03 DIAGNOSIS — M25531 Pain in right wrist: Secondary | ICD-10-CM

## 2012-10-03 NOTE — Assessment & Plan Note (Signed)
This seems like a flexor tendon injury that is causing him some chronic tendinopathy  We will start with a home exercise program to include ball squeezes and resistance with Thera-Band  He will use a wrist look for all his lifting and other working activities  Keep stress on wrist flexion limited  Recheck in 6 weeks and if not resolving we will ultrasound

## 2012-10-03 NOTE — Progress Notes (Signed)
HPI: Patient is a 37 y.o. Male who presents for followup of infrapatellar bursitis with mild patellar tendonopathy.  Reports that right knee pain is improved and he is pleased with the progress he has made.  Has been compliant with stretching and strengthening exercises of lower extremity.  Has also used nitroglycerin patches, which provided relief.  Patient also complains of right wrist pain in flexor compartment.  States that he has been writing a lot over the past year and has noticed a "popping" sensation in flexor compartment.  Also states when he does pushups and hand is hyperextended he feels pain in same region.  Does not hurt if he does knuckle pushup as he is keeping wrist straight.  Denies numbness, weakness, change in color of wrist or hand.  States that pain does not radiate.  Initially relieved by rest, however pain as become increasingly constant.  Physical Exam: General: well-developed, well-nourished, in NAD Right wrist: -good sensation throughout -5/5 strength -no edema or erythema noted -15 degrees radial deviation and 30 degrees ulnar deviation (compared with 20 degrees radial deviation and 40 degrees ulnar deviation or left side) -tenderness upon with flexion of fingers against active resistance. Resisted wrist flexion while the wrist was positioned in extension also creates pain -normal range of passive flexion and extension -No tenderness to direct palpation of carpal/metacarpals -No snuffbox tenderness  Negative augmented Phalen's test

## 2012-10-05 ENCOUNTER — Encounter: Payer: Self-pay | Admitting: Family Medicine

## 2012-10-05 ENCOUNTER — Ambulatory Visit (INDEPENDENT_AMBULATORY_CARE_PROVIDER_SITE_OTHER): Payer: 59 | Admitting: Family Medicine

## 2012-10-05 VITALS — BP 130/90 | HR 60 | Wt 188.0 lb

## 2012-10-05 DIAGNOSIS — B354 Tinea corporis: Secondary | ICD-10-CM

## 2012-10-05 MED ORDER — TERBINAFINE HCL 250 MG PO TABS: 250.0000 mg | ORAL_TABLET | Freq: Every day | ORAL | Status: DC

## 2012-10-05 NOTE — Progress Notes (Signed)
  Subjective:    Patient ID: James Bond, male    DOB: Sep 18, 1975, 37 y.o.   MRN: [id number]  HPI He does have difficulty with tinea cruris every year. He now notes a lesion present on his face.   Review of Systems     Objective:   Physical Exam Erythematous slightly raised lesion that is approximately 2 cm in size present on the right cheek. Exam of the anal area does show several raised round lesions with central clearing.       Assessment & Plan:   1. Tinea corporis  terbinafine (LAMISIL) 250 MG tablet   discussed topical versus systemic. He would prefer to be treated with systemic medication. I will treat him with 3 weeks of this and if any further difficulty, he will return to see me. Discussed the fact that we cannot do this but can control his. Also discussed the fact that during the summer this becomes more of an issue because hot weather environment acting as a fertilizer.

## 2012-12-11 ENCOUNTER — Telehealth: Payer: Self-pay | Admitting: Family Medicine

## 2012-12-11 DIAGNOSIS — B001 Herpesviral vesicular dermatitis: Secondary | ICD-10-CM

## 2012-12-11 MED ORDER — VALACYCLOVIR HCL 1 G PO TABS: ORAL_TABLET | ORAL | Status: DC

## 2012-12-11 NOTE — Telephone Encounter (Signed)
RCD rx request from [facility] Valacyclovir 1 gm tablets take 2 po onset cold sore  #20

## 2012-12-11 NOTE — Telephone Encounter (Signed)
Done

## 2012-12-11 NOTE — Telephone Encounter (Signed)
Please refill.

## 2013-11-28 ENCOUNTER — Ambulatory Visit (INDEPENDENT_AMBULATORY_CARE_PROVIDER_SITE_OTHER): Payer: 59 | Admitting: Family Medicine

## 2013-11-28 ENCOUNTER — Encounter: Payer: Self-pay | Admitting: Family Medicine

## 2013-11-28 VITALS — BP 110/78 | HR 72 | Ht 70.0 in | Wt 187.0 lb

## 2013-11-28 DIAGNOSIS — IMO0002 Reserved for concepts with insufficient information to code with codable children: Secondary | ICD-10-CM

## 2013-11-28 DIAGNOSIS — M25511 Pain in right shoulder: Secondary | ICD-10-CM

## 2013-11-28 DIAGNOSIS — M25519 Pain in unspecified shoulder: Secondary | ICD-10-CM

## 2013-11-28 DIAGNOSIS — S46911A Strain of unspecified muscle, fascia and tendon at shoulder and upper arm level, right arm, initial encounter: Secondary | ICD-10-CM

## 2013-11-28 MED ORDER — NAPROXEN 500 MG PO TABS: 500.0000 mg | ORAL_TABLET | Freq: Two times a day (BID) | ORAL | Status: DC

## 2013-11-28 NOTE — Progress Notes (Signed)
Chief Complaint  Patient presents with  . Shoulder Pain    Saturday he noticed some right shoudler pain that came out of nowhere. Moblility has decreased. At the very least he is in discomfort all the time.    While at dinner on Saturday night he noticed discomfort in his shoulder--not associated with any activity.  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  He took 1 aleve the first day, and then changed to ibuprofen when at work, took 600mg  twice daily.  It gave some temporary relief.  Pain is at posterior right shoulder, very sharp, pretty constant in nature.  Pain recently started radiating into the scapula.  He has pain with forward flexion, at about 70 degrees--and this has gotten worse in the last few days.  No pain with abduction, except at the top (180 degrees).  Denies numbness, tingling, weakness.  Denies any neck pain.  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  No problems until a few days later.  Pull-ups and push-ups don't cause/increase pain.  Also has pain reaching across his body  Past Medical History  Diagnosis Date  . 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 LBP (low back pain) h/o 11/2008,09/2009  . Fx ankle right(02/2009) (boot and pt)   History reviewed. No pertinent past surgical history. History   Social History  . Marital Status: Single    Spouse Name: N/A    Number of Children: N/A  . Years of Education: N/A   Occupational History  . Not on file.   Social History Main Topics  . Smoking status: Never Smoker   . Smokeless tobacco: Never Used  . Alcohol Use: Yes     Comment: 2-3 beers 3 times per week  . Drug Use: No  . Sexual Activity: Not on file   Other Topics Concern  . Not on file   Social History Narrative  . No narrative on file   Current Outpatient Prescriptions on File Prior to Visit  Medication Sig Dispense Refill  . valACYclovir (VALTREX)  1000 MG tablet Take 2 tablets at onset of cold sore, and repeat in 12 hours (taking 4 tablets per episode of outbreak)  20 tablet  0   No current facility-administered medications on file prior to visit.   No Known Allergies  ROS: no fevers, URI symptoms, headaches, dizziness, chest pain, shortness of breath, GI complaints, bleeding, bruising, rash, numbness, tingling, weakness, neck pain or any other concerns.  Knee pain has resolved.  PHYSICAL EXAM: BP 110/78  Pulse 72  Ht 5\' 10"  (1.778 m)  Wt 187 lb (84.823 kg)  BMI 26.83 kg/m2 Well developed, muscular male in no distress at rest. RUE:  Pain with supraspinatous testing.  Also had pain with internal and external rotation against resistance.  No pain with subscapularis testing. Full strength throughout. Tender just below acromion posteriorly, and into latissimus muscle. He has tight trapezius muscles in upper back bilaterally, R>L No impingement signs. nontender at [facility] joint  ASSESSMENT/PLAN:  Right shoulder pain  Right shoulder strain - Plan: naproxen (NAPROSYN) 500 MG tablet  Right shoulder pain--consistent with latissimus and rotator cuff strain vs tendonitis.  No evidence of tear. ROM, rest, NSAIDs.  F/u here, or with Dr. Oneida Alar (he has appt tentatively scheduled) if not improving

## 2013-11-28 NOTE — Patient Instructions (Signed)
Take the anti-inflammatories twice daily for at least a week (up to the full 2 weeks, only if needed). Do range of motion exercises/stretches as shown. Avoid heavy lifting (try and rest)  Consider PT if pain persists/worsen, vs cortisone shot (depending on which areas of strain remain--cortisone into shoulder will not help the latissimus portion, just the rotator cuff).  You can also consider following up with Dr. Oneida Alar if pain doesn't improve

## 2013-12-05 ENCOUNTER — Ambulatory Visit: Payer: 59 | Admitting: Sports Medicine

## 2014-07-15 ENCOUNTER — Telehealth: Payer: Self-pay | Admitting: Family Medicine

## 2014-07-15 DIAGNOSIS — B001 Herpesviral vesicular dermatitis: Secondary | ICD-10-CM

## 2014-07-15 MED ORDER — VALACYCLOVIR HCL 1 G PO TABS: ORAL_TABLET | ORAL | Status: DC

## 2014-07-15 NOTE — Telephone Encounter (Signed)
Pt needs refill on valtrex sent to [facility]. He is taking flight at 1.00.

## 2014-07-15 NOTE — Telephone Encounter (Signed)
done

## 2015-05-21 ENCOUNTER — Encounter: Payer: Self-pay | Admitting: Family Medicine

## 2015-05-21 ENCOUNTER — Ambulatory Visit (INDEPENDENT_AMBULATORY_CARE_PROVIDER_SITE_OTHER): Payer: Commercial Managed Care - HMO | Admitting: Family Medicine

## 2015-05-21 VITALS — BP 120/72 | HR 68 | Temp 98.7°F | Ht 70.0 in | Wt 189.0 lb

## 2015-05-21 DIAGNOSIS — J069 Acute upper respiratory infection, unspecified: Secondary | ICD-10-CM

## 2015-05-21 NOTE — Patient Instructions (Signed)
  I suspect that you have a viral URI.  You do have some evidence of pressure inequality in the ear drums related to the infection and from the travel.  Use decongestants such as sudafed as needed for sinus pressure, ear pressure/plugging. Consider also using Mucinex, if needed, to help loosen any thick mucus or phlegm. Call next week if your mucus becomes more discolored again, or if you have persistent or worsening ear pain for an antibiotic. Return for re-evaluation if new symptoms (ie shortness of breath, chest pain, worsening cough, etc).

## 2015-05-21 NOTE — Progress Notes (Signed)
Chief Complaint  Patient presents with  . Facial Pain    and pressure, face is swollen. Both ears feel very full. Drainage was bright green this past Monday, now is yellow to clear. No fevers. Was outside in [place] at an outdoor concert and inhaled many different things. QR:FXJOIT from parking lot, cigarette smoke as well as marijuana. (it is legal there).    3 days ago he started having some head congestion like the start of a cold. He was outisde, it was dusty, and he felt like he also had irritation from tobacco smoke and second hand marijuana smoke (he was at an outdoor concert).  The air was dry.  Initially mucus was bright green and bloody.  Since being home the mucus has been more yellowish-to-clear.  He had ear plugging, pressure in his ears, unable to pop since the flight. He had ear pain on the flight home.  This morning the pressure in his ears feels much better (has been a gradual improvement).  No known fevers. Slight sore throat, related to PND.  Denies significant cough. He has sinus pressure in both cheeks.  He also has some pressure at his temples bilaterally. No itchy, watery eyes. +sneezing. No sick contacts.  PMH, PSH, SH reviewed Meds: none No Known Allergies  ROS:  See above. No shortness of breath, chest pain, dizziness, nausea, vomiting, bowel changes, bleeding, bruising, rash, or any other complaints, other than those noted in HPI.  PHYSICAL EXAM: BP 120/72 mmHg  Pulse 68  Temp(Src) 98.7 F (37.1 C) (Tympanic)  Ht 5\' 10"  (1.778 m)  Wt 189 lb (85.73 kg)  BMI 27.12 kg/m2  Well developed, pleasant, well-appearing male in no distress HEENT: PERRL, EOMI, conjunctiva clear. Nasal mucosa is only mildly edematous, slightly pink. No purulence. sinuses are nontender. There is very slight soft tissue swelling and redness noted bilaterally (L>R) below both eyes--appears to be more like a sunburn.  TM's--right TM was retracted.  Very small amount of bleeding centrally in the TM  itself. Normal canal. Left TM had only minimal bleeding, small effusion, no erythema. OP is clear Neck: no lymphadenopathy or mass Heart: regular rate and rhythm without murmur Lungs: clear bilaterally Skin: no rashes Psych: normal mood, affect, hygiene and grooming Neuro: alert and oriented. Cranial nerves intact. Normal strength, sensation, gait  ASSESSMENT/PLAN:  Acute upper respiratory infection  Supportive measures. S/sx bacterial infection reviewed. Call/f/u if worsening  I suspect that you have a viral URI.  You do have some evidence of pressure inequality in the ear drums related to the infection and from the travel.  Use decongestants such as sudafed as needed for sinus pressure, ear pressure/plugging. Consider also using Mucinex, if needed, to help loosen any thick mucus or phlegm. Call next week if your mucus becomes more discolored again, or if you have persistent or worsening ear pain for an antibiotic. Return for re-evaluation if new symptoms (ie shortness of breath, chest pain, worsening cough, etc).

## 2016-02-02 ENCOUNTER — Ambulatory Visit (INDEPENDENT_AMBULATORY_CARE_PROVIDER_SITE_OTHER): Payer: Commercial Managed Care - HMO | Admitting: Family Medicine

## 2016-02-02 ENCOUNTER — Encounter: Payer: Self-pay | Admitting: Family Medicine

## 2016-02-02 VITALS — BP 114/74 | HR 60 | Ht 70.0 in | Wt 187.0 lb

## 2016-02-02 DIAGNOSIS — M79642 Pain in left hand: Secondary | ICD-10-CM | POA: Diagnosis not present

## 2016-02-02 NOTE — Progress Notes (Signed)
Chief Complaint  Patient presents with  . Hand Injury    left hand injury. This past Saturday injured hand in a grappling tournament. Cannot make a fist or close his hand.     While in a grappling competition 2 days ago, he developed pain in his left hand, most noticeable at the 4th MCP.   He thinks he may have injured it during the collar-grabbing portion. He doesn't recall a particular onset of pain, but after the match he noticed the hand was sore, achey. Was able to participate in another match afterwards without problems, but afterwards it continued to feel sore. Yesterday morning he woke up and noticed stiffness/decreased mobility of his left ring finger.  He has been icing it, and taking Aleve BID since Saturday. Since yesterday, he sees some improvement in his ROM and discomfort.    He denies numbness, tingling, weakness, significant pain.  PMH, PSH, SH reviewed  Current Outpatient Prescriptions on File Prior to Visit  Medication Sig Dispense Refill  . valACYclovir (VALTREX) 1000 MG tablet Take 2 tablets at onset of cold sore, and repeat in 12 hours (taking 4 tablets per episode of outbreak) (Patient not taking: Reported on 05/21/2015) 20 tablet 0   No current facility-administered medications on file prior to visit.   No Known Allergies  ROS: no fever, chills, URI symptoms, cough, shortness of breath, chest pain, bleeding, bruising, rash, numbness, tingling, weakness or other complaints, except as noted in HPI.  PHYSICAL EXAM:  BP 114/74 mmHg  Pulse 60  Ht 5\' 10"  (1.778 m)  Wt 187 lb (84.823 kg)  BMI 26.83 kg/m2 Well developed, pleasant male in no distress. Left hand: Mild soft tissue swelling noted at the left 4th MCP and distal 4th metacarpal. Slightly tender to palpation along the MCP and 4th distal metacarpal. FROM, but some discomfort with full extension and with flexion (slight decreased flexion)  Normal capillary refill Skin intact  ASSESSMENT/PLAN:  Left  hand pain - Plan: DG Hand Complete Left   Hand sprain/strain--supportive measures. Go for x-ray if worsening pain, swelling, bony tenderness.     Continue ice, ROM exercises. Avoid hyperextension  Go for x-ray if worsening pain/swelling in the next day or so  Plan is to RTW as scheduled on Thursday (took today off).

## 2016-02-02 NOTE — Patient Instructions (Signed)
Continue to ice, elevate. Mild range of motion exercises (do not overextend). Continue Aleve--increase to 2 tablets twice daily with food (if your stomach can tolerate). Go to [facility] if worsening bony pain in the hand develops for x-ray (orders are in the system).

## 2016-05-13 ENCOUNTER — Ambulatory Visit (INDEPENDENT_AMBULATORY_CARE_PROVIDER_SITE_OTHER): Payer: Commercial Managed Care - HMO | Admitting: Family Medicine

## 2016-05-13 ENCOUNTER — Encounter: Payer: Self-pay | Admitting: Family Medicine

## 2016-05-13 VITALS — BP 122/70 | HR 68 | Ht 70.0 in | Wt 184.0 lb

## 2016-05-13 DIAGNOSIS — L309 Dermatitis, unspecified: Secondary | ICD-10-CM

## 2016-05-13 DIAGNOSIS — Z23 Encounter for immunization: Secondary | ICD-10-CM | POA: Diagnosis not present

## 2016-05-13 NOTE — Progress Notes (Signed)
Chief Complaint  Patient presents with  . Rash    on face for a couple of days. It is it itchy. He thinks it may be ringworm.    Started 2 days ago with rash at right temple and L cheek, and has spread onto the left neck. Slightly itchy, and noticed it because it looked red. +yardwork.  Applying tea tree oil, maybe helped some, but today seemed "angry"--redder, larger.  H/o ringworm a few times, related to jujitsu, not recent. Denies any other new exposures or contacts.  PMH, PSH, SH unchanged  Outpatient Encounter Prescriptions as of 05/13/2016  Medication Sig  . valACYclovir (VALTREX) 1000 MG tablet Take 2 tablets at onset of cold sore, and repeat in 12 hours (taking 4 tablets per episode of outbreak) (Patient not taking: Reported on 05/21/2015)  . [DISCONTINUED] naproxen sodium (ANAPROX) 220 MG tablet Take 220 mg by mouth 2 (two) times daily with a meal.   No facility-administered encounter medications on file as of 05/13/2016.    No Known Allergies  ROS: no fevers, chills, URI symptoms, other rashes or concerns.  PHYSICAL EXAM: BP 122/70 (BP Location: Left Arm, Patient Position: Sitting, Cuff Size: Normal)   Pulse 68   Ht 5\' 10"  (1.778 m)   Wt 184 lb (83.5 kg)   BMI 26.40 kg/m   Well developed, pleasant male in no distress Skin: 2.5-3 cm x 1.5cm patch at the right temple that is mildly erythematous, dry/raised, with no central clearing or raised edges Left cheek--in the area of stubble/beard there is a patchy of flakiness.  Vague, no discrete margins, no erythema. On the left neck there is a raised erythematous linear/almost serpigenous area.  Not round, no flaking, just raised/red  ASSESSMENT/PLAN:  Dermatitis of face  Need for prophylactic vaccination and inoculation against influenza - Plan: Flu Vaccine QUAD 36+ mos PF IM (Fluarix & Fluzone Quad PF)    I suspect that the rash at the right temple is NOT ringworm, more of a contact dermatitis.  Given that the rash on  the left cheek started at the same time, as well as the neck, go ahead and use 1% hydrocortisone cream (OTC, ie Cortaid) 2-3 times daily for up to 7-10 days. If the rash on the neck is getting worse/spreading, then stop the cortisone there, and switch to a lamisil or lotrimin cream.  The appearance of the neck rash is potentially more consistent with ringworm (the others aren't).  Be in touch next week, and let us know how things are going.

## 2016-05-13 NOTE — Patient Instructions (Signed)
   I suspect that the rash at the right temple is NOT ringworm, more of a contact dermatitis.  Given that the rash on the left cheek started at the same time, as well as the neck, go ahead and use 1% hydrocortisone cream (OTC, ie Cortaid) 2-3 times daily for up to 7-10 days. If the rash on the neck is getting worse/spreading, then stop the cortisone there, and switch to a lamisil or lotrimin cream.  The appearance of the neck rash is potentially more consistent with ringworm (the others aren't).  Be in touch next week, and let us know how things are going.

## 2017-06-10 ENCOUNTER — Ambulatory Visit (INDEPENDENT_AMBULATORY_CARE_PROVIDER_SITE_OTHER): Payer: 59 | Admitting: Family Medicine

## 2017-06-10 ENCOUNTER — Encounter: Payer: Self-pay | Admitting: Family Medicine

## 2017-06-10 DIAGNOSIS — M25511 Pain in right shoulder: Secondary | ICD-10-CM

## 2017-06-10 DIAGNOSIS — M5010 Cervical disc disorder with radiculopathy, unspecified cervical region: Secondary | ICD-10-CM | POA: Diagnosis not present

## 2017-06-10 MED ORDER — PREDNISONE 10 MG PO TABS: ORAL_TABLET | ORAL | 0 refills | Status: DC

## 2017-06-10 NOTE — Patient Instructions (Addendum)
You have cervical radiculopathy (a pinched nerve in the neck) of C5 or C6 nerve. Prednisone 6 day dose pack to relieve irritation/inflammation of the nerve. Aleve 2 tabs twice a day with food for pain and inflammation - start day AFTER finishing prednisone. Simple range of motion exercises within limits of pain to prevent further stiffness. Consider physical therapy for stretching, exercises, traction, and modalities. Heat 15 minutes at a time 3-4 times a day to help with spasms. Watch head position when on computers, texting, when sleeping in bed - should in line with back to prevent further nerve traction and irritation. Activities as tolerated. If not improving we will consider an MRI. Call me in a week to let me know how you're doing.  Your left (error: right) shoulder pain is due to rotator cuff impingement. Try to avoid painful activities (overhead activities, lifting with extended arm) as much as possible. Subacromial injection may be beneficial to help with pain and to decrease inflammation. Consider physical therapy with transition to home exercise program. Do home exercise program with theraband and scapular stabilization exercises daily 3 sets of 10 once a day. If not improving at follow-up we will consider imaging, injection, physical therapy, and/or nitro patches.

## 2017-06-12 DIAGNOSIS — M5010 Cervical disc disorder with radiculopathy, unspecified cervical region: Secondary | ICD-10-CM | POA: Insufficient documentation

## 2017-06-12 DIAGNOSIS — M25511 Pain in right shoulder: Secondary | ICD-10-CM | POA: Insufficient documentation

## 2017-06-12 NOTE — Progress Notes (Signed)
PCP: Rita Ohara, MD  Subjective:   HPI: Patient is a 41 y.o. male here for left shoulder/arm pain.  Patient denies known injury or trauma. He states for about a week he's noticed weakness with pushups and pullups on the left side. Has some pain though dull at 0.5/10 level on the left trapezius area. No numbness or tingling.   Not noticed any deformity. Tried aleve. 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 Some right shoulder soreness also with overhead motions/reaching.  Past Medical History:  Diagnosis Date  . Fx ankle right(02/2009) (boot and pt)  . 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 LBP (low back pain) h/o 11/2008,09/2009    Current Outpatient Prescriptions on File Prior to Visit  Medication Sig Dispense Refill  . valACYclovir (VALTREX) 1000 MG tablet Take 2 tablets at onset of cold sore, and repeat in 12 hours (taking 4 tablets per episode of outbreak) (Patient not taking: Reported on 05/21/2015) 20 tablet 0   No current facility-administered medications on file prior to visit.     No past surgical history on file.  No Known Allergies  Social History   Social History  . Marital status: Married    Spouse name: N/A  . Number of children: N/A  . Years of education: N/A   Occupational History  . firefighter    Social History Main Topics  . Smoking status: Never Smoker  . Smokeless tobacco: Never Used  . Alcohol use Yes     Comment: 2-3 beers 3 times per week  . Drug use: No  . Sexual activity: Not on file   Other Topics Concern  . Not on file   Social History Narrative   Married.    Family History  Problem Relation Age of Onset  . Hypertension Mother   . Arthritis Mother        due to MVA  . Asthma Mother   . Allergy (severe) Mother   . Crohn's disease Sister   . Asthma Brother   . Cancer Maternal Grandmother        melanoma    BP 136/70   Ht 5\' 10"  (1.778 m)   Wt 180 lb (81.6 kg)   BMI 25.83 kg/m   Review of  Systems: See HPI above.     Objective:  Physical Exam:  Gen: NAD, comfortable in exam room  Neck: No gross deformity, swelling, bruising, atrophy. TTP minimally left trapezius.  No midline/bony TTP. FROM without pain. BUE strength 5/5 grossly.   Sensation intact to light touch.   1+ left bicep reflex, 2+ right bicep, bilateral triceps, brachioradialis tendons. Negative spurlings. Sensation intact to light touch.  Left shoulder: No swelling, ecchymoses.  No gross deformity. No TTP. FROM. Negative Hawkins, Neers. Negative Yergasons. Strength 5/5 with empty can and resisted internal/external rotation. Negative apprehension. NV intact distally.  Right shoulder: No swelling, ecchymoses.  No gross deformity. No TTP. FROM. Positive Hawkins, Neers. Negative Yergasons. Strength 5/5 with empty can and resisted internal/external rotation. Negative apprehension. NV intact distally.   Assessment & Plan:  1. Cervical radiculopathy - patient's distribution of pain along with muscle weakness, decreased biceps reflex suggest a mid-cervical radiculopathy.  Start with prednisone dose pack, transition to aleve.  Heat for spasms.  Discussed ergonomic issues.  Call us in a week for update on his status.  Consider physical therapy, MRI.  2. Right rotator cuff impingement - shown home exercises to do daily.

## 2017-06-12 NOTE — Assessment & Plan Note (Signed)
Right rotator cuff impingement - shown home exercises to do daily.

## 2017-06-12 NOTE — Assessment & Plan Note (Signed)
patient's distribution of pain along with muscle weakness, decreased biceps reflex suggest a mid-cervical radiculopathy.  Start with prednisone dose pack, transition to aleve.  Heat for spasms.  Discussed ergonomic issues.  Call us in a week for update on his status.  Consider physical therapy, MRI.

## 2017-06-20 ENCOUNTER — Telehealth: Payer: Self-pay | Admitting: Family Medicine

## 2017-06-20 NOTE — Telephone Encounter (Signed)
Patient has finished prednisone pack and states he does not feel any improvements

## 2017-06-20 NOTE — Telephone Encounter (Signed)
Ok to go ahead with MRI of cervical spine.  Thanks!

## 2017-06-23 NOTE — Telephone Encounter (Signed)
MRI order placed.

## 2017-06-23 NOTE — Addendum Note (Signed)
Addended by: Sherrie George F on: 06/23/2017 08:32 AM   Modules accepted: Orders

## 2017-12-14 ENCOUNTER — Encounter: Payer: Self-pay | Admitting: Family Medicine

## 2017-12-14 ENCOUNTER — Ambulatory Visit: Payer: 59 | Admitting: Family Medicine

## 2017-12-14 VITALS — BP 110/80 | HR 56 | Ht 70.0 in | Wt 195.2 lb

## 2017-12-14 DIAGNOSIS — M25511 Pain in right shoulder: Secondary | ICD-10-CM

## 2017-12-14 DIAGNOSIS — M7581 Other shoulder lesions, right shoulder: Secondary | ICD-10-CM

## 2017-12-14 MED ORDER — LIDOCAINE HCL (PF) 1 % IJ SOLN: 3.0000 mL | Freq: Once | INTRAMUSCULAR | Status: DC

## 2017-12-14 MED ORDER — TRIAMCINOLONE ACETONIDE 40 MG/ML IJ SUSP: 30.0000 mg | Freq: Once | INTRAMUSCULAR | Status: DC

## 2017-12-14 MED ORDER — BUPIVACAINE HCL 0.5 % IJ SOLN: 3.0000 mL | Freq: Once | INTRAMUSCULAR | Status: DC

## 2017-12-14 NOTE — Progress Notes (Signed)
Chief Complaint  Patient presents with  . Shoulder Pain    dull pain since last June, this am woke up and the pain had changed. (right shoulder).    Woke up at 4 this morning and wasn't able to lift his right arm due to shoulder pain.  He trained like normal yesterday, no acute injury or pain. Today it hurts to move or lift the right arm.  Denies numbness, tingling or weakness, just pain.    Last June he started having some right shoulder pain (while training in [place], sparring). Pain wasn't daily, but after a hard work-out, or sometimes related to work. Sometimes gets sharp pains in the front of the shoulder. Has pain with bicep curls if lifting over a certain weight.  Occasionally takes Aleve (1 or two) and it helps. Took two this morning, f/b hot shower and heating pad--slightly improved range of motion, but still in a lot of discomfort.  He has previously been seen by Dr. Oneida Alar, and also [facility]  PMH, [place], [place] reviewed  Current Outpatient Medications on File Prior to Visit  Medication Sig Dispense Refill  . Krill Oil 300 MG CAPS Take 2 capsules by mouth daily.    . naproxen sodium (ALEVE) 220 MG tablet Take 440 mg by mouth.    . valACYclovir (VALTREX) 1000 MG tablet Take 2 tablets at onset of cold sore, and repeat in 12 hours (taking 4 tablets per episode of outbreak) (Patient not taking: Reported on 05/21/2015) 20 tablet 0   No current facility-administered medications on file prior to visit.     No Known Allergies  ROS: no fever, chills, URI symptoms, chest pain, shortness of breath, GI complaints, bleeding, rash, numbness, tingling, weakness  PHYSICAL EXAM:  BP 110/80   Pulse (!) 56   Ht 5\' 10"  (1.778 m)   Wt 195 lb 3.2 oz (88.5 kg)   BMI 28.01 kg/m   Well appearing male, who appears comfortable at rest, until he moves his right shoulder.  In moderate discomfort with abduction. RUE:  Mildly tender at anterior right shoulder. No rash, redness, swelling. Active  abduction limited by significant pain to 60 degrees, passive about 100 Full forward flexion, internal rotation. External rotation against resistance is slightly weak on the right. Full strength elsewhere. Normal pulses.  Procedure: area was cleaned and prepped.  Injected with 30mg  kenalog, 3cc each of marcaine and lido without epi into subacromial space, from posterior aspect  He tolerated the procedure without complication, but didn't seem to get significant noticeable benefit immediately after the injection  ASSESSMENT/PLAN:  Tendinitis of right rotator cuff - no e/o tear.  given cortisone injection into subacromial bursa today. refer for PT. Consider ortho or Dr. Lyla Glassing med if not improving.  - Plan: triamcinolone acetonide (KENALOG-40) injection 30 mg, bupivacaine (MARCAINE) 0.5 % (with pres) injection 3 mL, lidocaine (PF) (XYLOCAINE) 1 % injection 3 mL, PR DRAIN/INJECT LARGE JOINT/BURSA  Acute pain of right shoulder - Plan: triamcinolone acetonide (KENALOG-40) injection 30 mg, bupivacaine (MARCAINE) 0.5 % (with pres) injection 3 mL, lidocaine (PF) (XYLOCAINE) 1 % injection 3 mL, PR DRAIN/INJECT LARGE JOINT/BURSA    Refer to [facility] Can take Aleve 2 BID. Discussed rest, shown ROM exercises.

## 2017-12-14 NOTE — Patient Instructions (Signed)
We are referring you to physical therapy for your shoulder. We gave you an injection into the bursa to help with the tendonitis. You may continue to use Aleve 2 pills twice daily with food, if needed for pain. Do the range of motion exercises as shown.   Rotator Cuff Tendinitis Rotator cuff tendinitis is inflammation of the tough, cord-like bands that connect muscle to bone (tendons) in the rotator cuff. The rotator cuff includes all of the muscles and tendons that connect the arm to the shoulder. The rotator cuff holds the head of the upper arm bone (humerus) in the cup (fossa) of the shoulder blade (scapula). This condition can lead to a long-lasting (chronic) tear. The tear may be partial or complete. What are the causes? This condition is usually caused by overusing the rotator cuff. What increases the risk? This condition is more likely to develop in athletes and workers who frequently use their shoulder or reach over their heads. This can include activities such as:  Tennis.  Baseball or softball.  Swimming.  Construction work.  Painting.  What are the signs or symptoms? Symptoms of this condition include:  Pain spreading (radiating) from the shoulder to the upper arm.  Swelling and tenderness in front of the shoulder.  Pain when reaching, pulling, or lifting the arm above the head.  Pain when lowering the arm from above the head.  Minor pain in the shoulder when resting.  Increased pain in the shoulder at night.  Difficulty placing the arm behind the back.  How is this diagnosed? This condition is diagnosed with a medical history and physical exam. Tests may also be done, including:  X-rays.  MRI.  Ultrasounds.  CT or MR arthrogram. During this test, a contrast material is injected and then images are taken.  How is this treated? Treatment for this condition depends on the severity of the condition. In less severe cases, treatment may include:  Rest. This  may be done with a sling that holds the shoulder still (immobilization). Your health care provider may also recommend avoiding activities that involve lifting your arm over your head.  Icing the shoulder.  Anti-inflammatory medicines, such as aspirin or ibuprofen.  In more severe cases, treatment may include:  Physical therapy.  Steroid injections.  Surgery.  Follow these instructions at home: If you have a sling:  Wear the sling as told by your health care provider. Remove it only as told by your health care provider.  Loosen the sling if your fingers tingle, become numb, or turn cold and blue.  Keep the sling clean.  If the sling is not waterproof, do not let it get wet. Remove it, if allowed, or cover it with a watertight covering when you take a bath or shower. Managing pain, stiffness, and swelling  If directed, put ice on the injured area. ? If you have a removable sling, remove it as told by your health care provider. ? Put ice in a plastic bag. ? Place a towel between your skin and the bag. ? Leave the ice on for 20 minutes, 2-3 times a day.  Move your fingers often to avoid stiffness and to lessen swelling.  Raise (elevate) the injured area above the level of your heart while you are lying down.  Find a comfortable sleeping position or sleep on a recliner, if available. Driving  Do not drive or use heavy machinery while taking prescription pain medicine.  Ask your health care provider when it is safe  to drive if you have a sling on your arm. Activity  Rest your shoulder as told by your health care provider.  Return to your normal activities as told by your health care provider. Ask your health care provider what activities are safe for you.  Do any exercises or stretches as told by your health care provider.  If you do repetitive overhead tasks, take small breaks in between and include stretching exercises as told by your health care provider. General  instructions  Do not use any products that contain nicotine or tobacco, such as cigarettes and e-cigarettes. These can delay healing. If you need help quitting, ask your health care provider.  Take over-the-counter and prescription medicines only as told by your health care provider.  Keep all follow-up visits as told by your health care provider. This is important. Contact a health care provider if:  Your pain gets worse.  You have new pain in your arm, hands, or fingers.  Your pain is not relieved with medicine or does not get better after 6 weeks of treatment.  You have cracking sensations when moving your shoulder in certain directions.  You hear a snapping sound after using your shoulder, followed by severe pain and weakness. Get help right away if:  Your arm, hand, or fingers are numb or tingling.  Your arm, hand, or fingers are swollen or painful or they turn white or blue. Summary  Rotator cuff tendinitis is inflammation of the tough, cord-like bands that connect muscle to bone (tendons) in the rotator cuff.  This condition is usually caused by overusing the rotator cuff, which includes all of the muscles and tendons that connect the arm to the shoulder.  This condition is more likely to develop in athletes and workers who frequently use their shoulder or reach over their heads.  Treatment generally includes rest, anti-inflammatory medicines, and icing. In some cases, physical therapy and steroid injections may be needed. In severe cases, surgery may be needed. This information is not intended to replace advice given to you by your health care provider. Make sure you discuss any questions you have with your health care provider. Document Released: 11/20/2003 Document Revised: 08/16/2016 Document Reviewed: 08/16/2016 Elsevier Interactive Patient Education  2017 [employer].

## 2017-12-15 DIAGNOSIS — M25511 Pain in right shoulder: Secondary | ICD-10-CM | POA: Diagnosis not present

## 2017-12-19 DIAGNOSIS — M25511 Pain in right shoulder: Secondary | ICD-10-CM | POA: Diagnosis not present

## 2017-12-22 DIAGNOSIS — M25511 Pain in right shoulder: Secondary | ICD-10-CM | POA: Diagnosis not present

## 2017-12-28 DIAGNOSIS — M25511 Pain in right shoulder: Secondary | ICD-10-CM | POA: Diagnosis not present

## 2019-09-19 ENCOUNTER — Telehealth: Payer: Self-pay

## 2019-09-19 NOTE — Telephone Encounter (Signed)
Ok thank you 

## 2019-09-19 NOTE — Telephone Encounter (Signed)
Pt. 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 Has been tested for COVID and it was negative, pt. Has no symptoms as well, is he ok for his visit to be in office.

## 2019-09-19 NOTE — Telephone Encounter (Signed)
[place] for in office, assuming he doesn't develop symptoms at time of visit, when he is re-screened.

## 2019-09-24 ENCOUNTER — Ambulatory Visit (INDEPENDENT_AMBULATORY_CARE_PROVIDER_SITE_OTHER): Payer: 59 | Admitting: Family Medicine

## 2019-09-24 ENCOUNTER — Encounter: Payer: Self-pay | Admitting: Family Medicine

## 2019-09-24 ENCOUNTER — Other Ambulatory Visit: Payer: Self-pay

## 2019-09-24 VITALS — BP 140/88 | HR 84 | Temp 96.2°F | Ht 70.0 in | Wt 198.8 lb

## 2019-09-24 DIAGNOSIS — N5089 Other specified disorders of the male genital organs: Secondary | ICD-10-CM

## 2019-09-24 DIAGNOSIS — R030 Elevated blood-pressure reading, without diagnosis of hypertension: Secondary | ICD-10-CM

## 2019-09-24 DIAGNOSIS — Z125 Encounter for screening for malignant neoplasm of prostate: Secondary | ICD-10-CM

## 2019-09-24 NOTE — Patient Instructions (Signed)
Your prostate exam was normal. You have a small cyst palpable just next to the right testicle.  We should check this yearly to ensure no change in size.  You should also check this area for change when doing your self-testicular exams.  The area of your concern is within the scrotal skin, and I am not concerned about this.  It is possible that a cyst is forming.  If it continues to enlarge and becomes painful, it may be that it has become infected.  Return for recheck if it becomes painful, red, continuing to increase in size.  Your blood pressure was elevated--you were VERY nervous for the exam, so I'm sure that is why.  Stay well!

## 2019-09-24 NOTE — Progress Notes (Signed)
Chief Complaint  Patient presents with  . Mass    on scrotum x 1 month. No pain.    Flat lump noted in the scrotum. He feels like it is internal, not on testicle, not mobile, not tender. It has increased in size some over the last 4-6 weeks.  Wife wants him to have prostate exam done also (since he is over 19 and hasn't had yet). He admits that he is very anxious about today's examination. 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  PMH, PSH, SH and FH updated/reviewed  Outpatient Encounter Medications as of 09/24/2019  Medication Sig Note  . Krill Oil 300 MG CAPS Take 2 capsules by mouth daily.   Marland Kitchen VITAMIN D PO Take 2 tablets by mouth. 09/24/2019: 2 gummies daily,  unsure of dose  . naproxen sodium (ALEVE) 220 MG tablet Take 440 mg by mouth.   . valACYclovir (VALTREX) 1000 MG tablet Take 2 tablets at onset of cold sore, and repeat in 12 hours (taking 4 tablets per episode of outbreak) (Patient not taking: Reported on 05/21/2015)    Facility-Administered Encounter Medications as of 09/24/2019  Medication  . bupivacaine (MARCAINE) 0.5 % (with pres) injection 3 mL  . lidocaine (PF) (XYLOCAINE) 1 % injection 3 mL  . triamcinolone acetonide (KENALOG-40) injection 30 mg    No Known Allergies  ROS: no fever, chills, URI symptoms, GI complaints, urinary complaints.  Just scrotal concern per HPI.   PHYSICAL EXAM:  BP 140/88   Pulse 84   Temp (!) 96.2 F (35.7 C) (Other (Comment))   Ht 5\' 10"  (1.778 m)   Wt 198 lb 12.8 oz (90.2 kg)   BMI 28.52 kg/m   Well-appearing, very anxious male, in no distress GU exam: testicles are descended bilaterally.  There is a soft mass palpable just medial to the right testicle--there does appear to be separation from the testicle.  It is soft, nontender.  Testicles are smooth, no masses. His area of concern is within the line running down the midline of the scrotum--about midway there is a slight thickening.   There is no overlying erythema, warmth.  It is nontender.  When pushed to the surface, it appears to have a white appearance. Rectal: normal sphincter tone.  Prostate is smooth, slightly larger than expected for age, but uniform, smooth, no nodules. Heme negative stool He was very anxious, but overall tolerated exam well.    ASSESSMENT/PLAN:  Scrotal mass - on R, suspect a benign cyst; monitor for change, [place] if enlarging, tender.  His concern is within the skin, likely small cyst, not infected, reassured  Screening for prostate cancer - normal prostate exam  Blood pressure elevated without history of HTN - related to anxiety about today's exam. Will check elsewhere to ensure it is normal

## 2019-11-12 ENCOUNTER — Telehealth: Payer: Self-pay

## 2019-11-12 NOTE — Telephone Encounter (Signed)
Pt. Called stating he wanted to get a message back to you about his upcoming travel 11/20/19. Pt. Stated he has had both Covid shot already last one was 10/15/19 and he just wanted to know your thoughts on traveling right now during covid. If you or your nurse could give him a call back.

## 2019-11-12 NOTE — Telephone Encounter (Signed)
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  We discussed safety parameters related to plane travel, and eating out, and precautions he can take to minimize illnesses.  All questions answered.

## 2020-08-04 ENCOUNTER — Ambulatory Visit (INDEPENDENT_AMBULATORY_CARE_PROVIDER_SITE_OTHER): Payer: 59 | Admitting: Psychiatry

## 2020-08-04 ENCOUNTER — Other Ambulatory Visit: Payer: Self-pay

## 2020-08-04 DIAGNOSIS — F411 Generalized anxiety disorder: Secondary | ICD-10-CM
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
## 2020-08-06 ENCOUNTER — Ambulatory Visit: Payer: 59 | Admitting: Psychiatry

## 2020-08-18 ENCOUNTER — Ambulatory Visit: Payer: 59 | Admitting: Psychiatry

## 2020-08-25 ENCOUNTER — Other Ambulatory Visit: Payer: Self-pay

## 2020-08-25 ENCOUNTER — Ambulatory Visit (INDEPENDENT_AMBULATORY_CARE_PROVIDER_SITE_OTHER): Payer: 59 | Admitting: Psychiatry

## 2020-08-25 DIAGNOSIS — F411 Generalized anxiety disorder: Secondary | ICD-10-CM

## 2020-08-25 NOTE — Progress Notes (Signed)
[facility] Counselor/Therapist Progress Note  Patient ID: ZAHIR EISENHOUR, MRN: [id number],    Date: 08/25/2020  Time Spent: 1:00pm to 2:00pm  Treatment Type: Individual Therapy  Reported Symptoms: anger, sadness, anxiety  Mental Status Exam:  Appearance:   Casual     Behavior:  Appropriate, Sharing and Motivated  Motor:  Normal  Speech/Language:   Clear and Coherent  Affect:  anxious  Mood:  anxious and sad  Thought process:  normal  Thought content:    WNL  Sensory/Perceptual disturbances:    WNL  Orientation:  oriented to person, place, time/date, situation, day of week, month of year and year  Attention:  Fair  Concentration:  Fair  Memory:  WNL  Fund of knowledge:   Good  Insight:    Good  Judgment:   Good  Impulse Control:  Good   Risk Assessment: Danger to Self:  No Self-injurious Behavior: No Danger to Others: No Duty to Warn:no Physical Aggression / Violence:No  Access to Firearms a concern: No  Gang Involvement:No   Subjective:  Patient reporting some anger, sadness, and anxiety regarding mother's death recently and contacts with family.  Interventions: Cognitive Behavioral Therapy, Solution-Oriented/Positive Psychology and Ego-Supportive  Diagnosis:   ICD-10-CM   1. Generalized anxiety disorder  F41.1      Plan: (Have seen this patient before and today we updated his treatment goal plan.) Patient not signing treatment goal plan on computer screen due to COVID.  Treatment Goals: Goals remain on treatment plan as patient works with strategies to achieve his goals.  Progress will be noted each session and documented in the "progress" section of note.  Long term goal: Reduce overall level, frequency, and intensity of the anxiety so that daily functioning in not impaired.  Short term goal: Increase understanding of beliefs and messages and beliefs that produce the worry and anxiety.  Strategies: Help the patient develop reality-based  positive cognitive messages.   Progress: Patient in today reporting sadness, some anger, anxiety after mom's recent death,  "but definitely better.""Have been dealing with my grief but back at work trying to function ok, and I catch my thinking not being too good. Concerned about family members he connected with during funeral time for his mom.   Very mixed feelings re: family, mom, and dad (in [place]) who he does keep in contact with. Hurts that he doesn't have any blood relatives that he is really close to.  Processed in good detail his sadness, grief, hurt, anger, and anxiety in session today.  He states that he has done better with some of these things and felt like processing the more today would help.  Has a clear sense on what he can do to help certain people in the family and what he cannot do.  Also shared some work issues that he is currently considering as he looks into the future.  Reports feeling more connected again with people here that are his friends, and coworkers after the experience of having gone to his mother's funeral and interacted with relatives that he had not seen a long time and who are very different from him and their choices, which left patient with some real emotions and feelings which he has processed very effectively.  Talked about ways that he can continue this process and also some of the decisions he is considering.  Much more grounded today and brighter affect, along with a clearer realization of "what I can control and what I  cannot control."  Encouraged him to continue good physical care and exercise, staying in the present, keeping the focus on what he can control versus cannot control, keeping self talk encouraging and positive, enjoying the healthy relationships here that he has, and remaining close to people that are supportive of him.   Goal review and progress/challenges noted with patient.  Will call for next appt.  Shanon Ace,  LCSW

## 2020-12-16 ENCOUNTER — Telehealth: Payer: Self-pay | Admitting: Family Medicine

## 2020-12-16 DIAGNOSIS — B001 Herpesviral vesicular dermatitis: Secondary | ICD-10-CM

## 2020-12-16 MED ORDER — VALACYCLOVIR HCL 1 G PO TABS
ORAL_TABLET | ORAL | 0 refills | Status: AC
Start: 2020-12-16 — End: ?

## 2020-12-16 NOTE — Telephone Encounter (Signed)
Please advise pt that I sent in refill to his [facility].  Upon reviewing his chart, I think he should schedule a physical.  I know he gets yearly physicals through the [employer], but I think it is time to set one up with me as well (he will be 41 in September, will need discussion of colon cancer screening).  Also, unsure if immunizations have been updated through his job (last tetanus over 10 years ago, etc).  I'd love for him to bring any labwork that he gets done through work, as well as any immunizations with him to his visit, that way we don't need to duplicate anything.  Thanks

## 2020-12-16 NOTE — Telephone Encounter (Signed)
Pt called and said he has some cold sores and wanted to see if you could call in a prescription for Valtrex. He stated he had this issue in the past and was prescribed Valtrex.

## 2020-12-16 NOTE — Telephone Encounter (Signed)
Spoke to pt and he said thank you for calling that in for him. He said he will call back at a later date to schedule a CPE with you and he will bring any labs and shots he had previously

## 2021-04-13 ENCOUNTER — Ambulatory Visit: Payer: Self-pay | Admitting: Family Medicine

## 2021-04-20 ENCOUNTER — Ambulatory Visit: Payer: 59 | Admitting: Family Medicine

## 2021-04-20 ENCOUNTER — Other Ambulatory Visit: Payer: Self-pay

## 2021-04-20 ENCOUNTER — Encounter: Payer: Self-pay | Admitting: Family Medicine

## 2021-04-20 VITALS — BP 128/82 | HR 64 | Ht 70.0 in | Wt 196.4 lb

## 2021-04-20 DIAGNOSIS — D2372 Other benign neoplasm of skin of left lower limb, including hip: Secondary | ICD-10-CM

## 2021-04-20 NOTE — Progress Notes (Signed)
Chief Complaint  Patient presents with   Mass    Left leg bump that has been there for about 4 months-if he presses on it there is some pain.    First noted a lump at the inner portion of his left knee about 3-4 months ago. No known injury or trauma.  Hasn't changed  in size since he first noted it.  Slightly tender if pressed firmly.  Otherwise, not really noticeable or bothersome.  His wife wanted him to get it checked out.  He denies any other concerns today.  PMH, PSH, SH reviewed  Outpatient Encounter Medications as of 04/20/2021  Medication Sig Note   naproxen sodium (ALEVE) 220 MG tablet Take 440 mg by mouth. (Patient not taking: Reported on 04/20/2021)    valACYclovir (VALTREX) 1000 MG tablet Take 2 tablets at onset of cold sore, and repeat in 12 hours (taking 4 tablets per episode of outbreak) (Patient not taking: Reported on 04/20/2021)    [DISCONTINUED] Krill Oil 300 MG CAPS Take 2 capsules by mouth daily.    [DISCONTINUED] VITAMIN D PO Take 2 tablets by mouth. 09/24/2019: 2 gummies daily,  unsure of dose   Facility-Administered Encounter Medications as of 04/20/2021  Medication   bupivacaine (MARCAINE) 0.5 % (with pres) injection 3 mL   lidocaine (PF) (XYLOCAINE) 1 % injection 3 mL   triamcinolone acetonide (KENALOG-40) injection 30 mg   No Known Allergies  ROS: Denies fever, chills, URI symptoms, headaches, dizziness, shortness of breath, chest pain, GI complaints.  No bleeding, bruising or skin concerns other than lesion at L knee. See HPI   PHYSICAL EXAM:  BP 128/82   Pulse 64   Ht '5\' 10"'$  (1.778 m)   Wt 196 lb 6.4 oz (89.1 kg)   BMI 28.18 kg/m   Pleasant, well-appearing male in no distress HEENT: conjunctiva and sclera are clear, EOMI. Wearing mask Skin:  at medial aspect of L knee there is a 5x63m soft tissue mass.  It is just slightly pink, mainly flesh-colored.  Not pigmented.  Nontender.  No central pore.  It indents slightly down into the skin when pinched. Normal  skin turgor, no other lesions.   ASSESSMENT/PLAN:  Dermatofibroma of left lower leg - L medial knee.  Reassurred. Return for excision (vs referral) if increasing in size, pain, or other concerns

## 2021-04-20 NOTE — Patient Instructions (Signed)
I believe the lesion on the left leg is a dermatofibroma. This is benign. It likely will not increase in size much, or change. If it becomes bothersome, if it changes in size, color or any other concern, I'd be happy to remove it in the office vs refer you to a dermatologist.

## 2021-06-08 ENCOUNTER — Encounter: Payer: Self-pay | Admitting: Family Medicine

## 2021-06-08 ENCOUNTER — Other Ambulatory Visit: Payer: Self-pay

## 2021-06-08 ENCOUNTER — Telehealth (INDEPENDENT_AMBULATORY_CARE_PROVIDER_SITE_OTHER): Payer: 59 | Admitting: Family Medicine

## 2021-06-08 VITALS — Ht 70.0 in | Wt 195.0 lb

## 2021-06-08 DIAGNOSIS — J029 Acute pharyngitis, unspecified: Secondary | ICD-10-CM

## 2021-06-08 DIAGNOSIS — J0140 Acute pansinusitis, unspecified: Secondary | ICD-10-CM | POA: Diagnosis not present

## 2021-06-08 MED ORDER — AMOXICILLIN 875 MG PO TABS: 875.0000 mg | ORAL_TABLET | Freq: Two times a day (BID) | ORAL | 0 refills | Status: AC

## 2021-06-08 NOTE — Progress Notes (Signed)
   Subjective:  Documentation for virtual audio and video telecommunications through [place] encounter:  The patient was located at home. 2 patient identifiers used.  The provider was located in the office. The patient did consent to this visit and is aware of possible charges through their insurance for this visit.  The other persons participating in this telemedicine service were none. Time spent on call was 15 minutes and in review of previous records 18 minutes total.  This virtual service is not related to other E/M service within previous 7 days.   Patient ID: James Bond, male    DOB: June 26, 1976, 45 y.o.   MRN: [id number]  HPI Chief Complaint  Patient presents with   other    Sinus infection, ST, congestion, yellow and green mucous   Complains of a one week history of scratchy throat, fatigue, and now has sinus pressure, purulent nasal drainage and post nasal drainage.   Rapid and PCR Covid tests both negative.   Denies smoking. No recent antibiotics.   Has not taken anything for his symptoms.   No fever, chills, ear pain, chest pain, cough, shortness of breath, N/V/D.   Reviewed allergies, medications, past medical, surgical, family, and social history.    Review of Systems Pertinent positives and negatives in the history of present illness.     Objective:   Physical Exam Ht 5\' 10"  (1.778 m)   Wt 195 lb (88.5 kg)   BMI 27.98 kg/m   Alert and oriented and in no acute distress. Respirations unlabored.       Assessment & Plan:  Acute non-recurrent pansinusitis - Plan: amoxicillin (AMOXIL) 875 MG tablet  Acute pharyngitis, unspecified etiology  Discussed symptomatic treatment including Tylenol or ibuprofen, salt water gargles, steroid nasal spray, sinus medication OTC with a decongestant and antihistamine, hydration. Amoxil prescribed.  Follow up if worsening or not back to baseline once he completes the antibiotic.

## 2021-12-16 ENCOUNTER — Ambulatory Visit: Payer: 59 | Admitting: Family Medicine

## 2021-12-16 ENCOUNTER — Encounter: Payer: Self-pay | Admitting: Family Medicine

## 2021-12-16 VITALS — BP 102/62 | HR 76 | Ht 70.0 in | Wt 193.8 lb

## 2021-12-16 DIAGNOSIS — Z1211 Encounter for screening for malignant neoplasm of colon: Secondary | ICD-10-CM

## 2021-12-16 DIAGNOSIS — E782 Mixed hyperlipidemia: Secondary | ICD-10-CM | POA: Diagnosis not present

## 2021-12-16 DIAGNOSIS — R9431 Abnormal electrocardiogram [ECG] [EKG]: Secondary | ICD-10-CM

## 2021-12-16 NOTE — Patient Instructions (Addendum)
Continue to limit fast food, fried foods, sugar, and alcohol from your diet. ?This should help lower your triglycerides. ?Continue to get regular exercise. ?Consider taking omega-3 fish oil (2000-4000 IU daily). ? ?We should recheck your cholesterol panel in 3-6 months. ? ?Your EKG is unchanged from 2018, and is not concerning for heart disease. ? ?You are due for colon cancer screening.  We briefly discussed Cologuard vs colonoscopy. ?You will need a referral for either. Let me know if you have a preference, and if there is a preferred GI doctor/group. ? ?Please send me copies of your ultrasound screening tests when you get them. ?
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
## 2021-12-21 ENCOUNTER — Encounter: Payer: Self-pay | Admitting: Family Medicine

## 2022-03-03 ENCOUNTER — Emergency Department (HOSPITAL_COMMUNITY)
Admission: EM | Admit: 2022-03-03 | Discharge: 2022-03-04 | Disposition: A | Discharge status: Home / Self Care | Payer: 59 | Attending: Emergency Medicine | Admitting: Emergency Medicine

## 2022-03-03 ENCOUNTER — Encounter (HOSPITAL_COMMUNITY): Payer: Self-pay

## 2022-03-03 ENCOUNTER — Other Ambulatory Visit: Payer: Self-pay

## 2022-03-03 DIAGNOSIS — W290XXA Contact with powered kitchen appliance, initial encounter: Secondary | ICD-10-CM | POA: Diagnosis not present

## 2022-03-03 DIAGNOSIS — Z23 Encounter for immunization: Secondary | ICD-10-CM | POA: Diagnosis not present

## 2022-03-03 DIAGNOSIS — S81812A Laceration without foreign body, left lower leg, initial encounter: Secondary | ICD-10-CM

## 2022-03-03 DIAGNOSIS — S61214A Laceration without foreign body of right ring finger without damage to nail, initial encounter: Secondary | ICD-10-CM

## 2022-03-03 DIAGNOSIS — S65504A Unspecified injury of blood vessel of right ring finger, initial encounter: Secondary | ICD-10-CM | POA: Diagnosis present

## 2022-03-03 MED ORDER — TETANUS-DIPHTH-ACELL PERTUSSIS 5-2.5-18.5 LF-MCG/0.5 IM SUSY
0.5000 mL | PREFILLED_SYRINGE | Freq: Once | INTRAMUSCULAR | Status: AC
  Administered 2022-03-04: 0.5 mL via INTRAMUSCULAR
  Filled 2022-03-03: qty 0.5

## 2022-03-03 NOTE — ED Provider Triage Note (Addendum)
Emergency Medicine Provider Triage Evaluation Note  VERSIE FLEENER , a 46 y.o. male  was evaluated in triage.  Pt complains of lacerations to left leg and left 4th digit.  He dropped a blender while washing dishes and it broke.  Last tetanus unknown.  Review of Systems  Positive: lacerations Negative: Fever, numbness  Physical Exam  BP 134/87   Pulse 72   Temp 98.4 F (36.9 C) (Oral)   Resp 18   SpO2 96%   Gen:   Awake, no distress   Resp:  Normal effort  MSK:   Moves extremities without difficulty  Other:  Laceration to left 4th finger and left shin, wounds hemostatic with dressings  Medical Decision Making  Medically screening exam initiated at 11:41 PM.  Appropriate orders placed.  Everitt Amber was informed that the remainder of the evaluation will be completed by another provider, this initial triage assessment does not replace that evaluation, and the importance of remaining in the ED until their evaluation is complete.  Lacerations.  Will update tetanus.  Will need repair.   Larene Pickett, PA-C 03/03/22 [id number]    Larene Pickett, PA-C 03/03/22 [phone]

## 2022-03-03 NOTE — ED Triage Notes (Signed)
Pt states that he was washing dishes and dropped a blender, it hit his L lower leg, appx 4in lac, bleeding controlled, dressing applied in triage, last tetanus unknown

## 2022-03-04 ENCOUNTER — Telehealth: Payer: Self-pay | Admitting: Family Medicine

## 2022-03-04 MED ORDER — LIDOCAINE-EPINEPHRINE (PF) 2 %-1:200000 IJ SOLN
INTRAMUSCULAR | Status: AC
  Administered 2022-03-04: 20 mL
  Filled 2022-03-04: qty 20

## 2022-03-04 MED ORDER — LIDOCAINE-EPINEPHRINE (PF) 2 %-1:200000 IJ SOLN: 20.0000 mL | Freq: Once | INTRAMUSCULAR | Status: AC

## 2022-03-04 NOTE — Discharge Instructions (Signed)
You should have your sutures removed in 10-14 days.  Keep the wound clean and dry for the first 24-48 hours.  After that it is ok to clean with soap and water, but don't submerge it.

## 2022-03-04 NOTE — Telephone Encounter (Signed)
Pt was called concerning recent ER visit. Pt states that he has 6 to 8 inch gash in his leg that required stitches. Pt states he is doing ok. Pt was advised that it was recommended that he return here in 10 days to have stitches removed. 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 Pt did advise that it was not a work injury but he still has to go thru them due to having his job duties changed. He states that they will probably follow him including removing stitches. Pt was advised that if something changed to give [place] a call and we would be happy to assist.

## 2022-05-19 ENCOUNTER — Encounter: Payer: Self-pay | Admitting: Internal Medicine

## 2022-06-22 ENCOUNTER — Encounter: Payer: Self-pay | Admitting: Internal Medicine

## 2022-11-02 ENCOUNTER — Encounter: Payer: Self-pay | Admitting: Family Medicine

## 2022-11-02 NOTE — Progress Notes (Unsigned)
No chief complaint on file.  Patient presents for visit, as he requested refills on fluconazole via a MyChart message. This had been prescribed to him by a dermatologist in 03/2022, taken 236m weekly x 4 weeks.  He states the skin condition that was treated has recurred. No records from the dermatologist have been received.    PMH, PSH, SH revewed  He never returned for f/u lipids (supposed to be done 06/2022) Iipids from 10/27/21: T chol 210, HDL 36, TG 220, LDL 137   ROS:   PHYSICAL EXAM:  There were no vitals taken for this visit.  Wt Readings from Last 3 Encounters:  12/16/21 193 lb 12.8 oz (87.9 kg)  06/08/21 195 lb (88.5 kg)  04/20/21 196 lb 6.4 oz (89.1 kg)      ASSESSMENT/PLAN:  Has he had labs done by fire dept again?  He never returned for lipids (supposed to have done 06/2022), to f/u on high TG after dietary improvement.

## 2022-11-03 ENCOUNTER — Encounter: Payer: Self-pay | Admitting: Family Medicine

## 2022-11-03 ENCOUNTER — Ambulatory Visit (INDEPENDENT_AMBULATORY_CARE_PROVIDER_SITE_OTHER): Payer: 59 | Admitting: Family Medicine

## 2022-11-03 VITALS — BP 118/78 | HR 68 | Ht 70.0 in | Wt 180.8 lb

## 2022-11-03 DIAGNOSIS — E782 Mixed hyperlipidemia: Secondary | ICD-10-CM | POA: Diagnosis not present

## 2022-11-03 DIAGNOSIS — L309 Dermatitis, unspecified: Secondary | ICD-10-CM

## 2022-11-03 DIAGNOSIS — Z8619 Personal history of other infectious and parasitic diseases: Secondary | ICD-10-CM

## 2022-11-03 MED ORDER — TRIAMCINOLONE ACETONIDE 0.1 % EX CREA: TOPICAL_CREAM | CUTANEOUS | 0 refills | Status: AC

## 2022-11-03 MED ORDER — FLUCONAZOLE 200 MG PO TABS: ORAL_TABLET | ORAL | 0 refills | Status: DC

## 2022-11-03 NOTE — Patient Instructions (Signed)
Continue to stay well hydrated. Try and avoid prolonged hot showers. Dry skin is itchy and can trigger eczema flares. Moisturize with a good CREAM (not lotions) such as Cetaphil, cerevie or eucerin. You can consider an ointment (covered to avoid messy sheets). I'm prescribing a steroid cream for you to use only on the affected areas of skin, twice daily for up to 2 weeks. If the rash seems to be spreading and worse with using the steroids, then STOP, and switch to the oral fluconazole.  Worsening rash with steroids can indicate that this is a fungal infection (and not eczema as I suspect).

## 2023-03-14 ENCOUNTER — Encounter: Payer: Self-pay | Admitting: Family Medicine

## 2023-03-15 ENCOUNTER — Other Ambulatory Visit: Payer: Self-pay | Admitting: *Deleted

## 2023-03-15 DIAGNOSIS — Z1211 Encounter for screening for malignant neoplasm of colon: Secondary | ICD-10-CM

## 2023-03-28 ENCOUNTER — Ambulatory Visit: Payer: 59 | Admitting: Psychiatry

## 2023-03-28 DIAGNOSIS — F4323 Adjustment disorder with mixed anxiety and depressed mood: Secondary | ICD-10-CM
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
## 2023-04-12 ENCOUNTER — Ambulatory Visit (INDEPENDENT_AMBULATORY_CARE_PROVIDER_SITE_OTHER): Payer: 59 | Admitting: Psychiatry

## 2023-04-12 DIAGNOSIS — F4323 Adjustment disorder with mixed anxiety and depressed mood: Secondary | ICD-10-CM
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
## 2023-04-21 ENCOUNTER — Ambulatory Visit: Payer: 59 | Admitting: Psychiatry

## 2023-04-21 DIAGNOSIS — F4323 Adjustment disorder with mixed anxiety and depressed mood: Secondary | ICD-10-CM

## 2023-04-21 NOTE — Progress Notes (Signed)
[facility] Counselor/Therapist Progress Note  Patient ID: James Bond, MRN: [id number],    Date: 04/21/2023  Time Spent: 53 minutes   Treatment Type: Individual Therapy  Reported Symptoms: anxiety  Mental Status Exam:  Appearance:   Casual     Behavior:  Appropriate, Sharing, and Motivated  Motor:  Normal  Speech/Language:   Clear and Coherent  Affect:  anxious  Mood:  anxious  Thought process:  goal directed  Thought content:    WNL  Sensory/Perceptual disturbances:    WNL  Orientation:  oriented to person, place, time/date, situation, day of week, month of year, year, and stated date of Aug. 8, 2024  Attention:  Good  Concentration:  Good  Memory:  WNL  Fund of knowledge:   Good  Insight:    Good  Judgment:   Good  Impulse Control:  Good   Risk Assessment: Danger to Self:  No Self-injurious Behavior: No Danger to Others: No Duty to Warn:no Physical Aggression / Violence:No  Access to Firearms a concern: No  Gang Involvement:No   Subjective: Patient in session today reporting anxiety, mood not quite as up-and-down and definitely not extreme changes, no depression nor anger, grief/sadness more resolved.  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  Feeling better with now he is manage edging his feelings about that situation and loss. Continued and has made significant progress in his work on more family issues and particular feeling a lack of immediate family other than his wife. Processing more family issues from past, some of which were "intense". (Not all details included in this note due to patient privacy needs.) Even more open today in his talking. Less anxious. Managing hurt and sadness better and working to move forward, while being patient with himself in the meantime. Shares how he has followed up with prior homework and is feeling better and needs to  continue work outside of therapy on "letting go of some of my hard feelings" and continue making healthier choices. States being more self aware of "when old things from past jump on me" and use some of the skills I've used more recently."Venting has really helped" and plans to continue with goal directed behaviors.  Not needing to reschedule right now and we will definitely call as needed.  Feeling much more self-confident.  Interventions: Cognitive Behavioral Therapy and Ego-Supportive  Long term goal: Reduce overall level, frequency, and intensity of the anxiety and depression so that daily functioning in not impaired.  Short term goal: Increase understanding of beliefs and messages and beliefs that produce the worry and anxiety. Strategies: Help the patient develop reality-based positive cognitive messages.   Diagnosis:   ICD-10-CM   1. Adjustment disorder with mixed anxiety and depressed mood  F43.23      Plan: Patient in today working more on his anxiety and depression particularly in some areas of his life that he is focusing on and wanting to strengthen and "feel more at peace" with, relating especially to personal and family relationships/dynamics. (Not all details included in this note due to patient privacy needs.)  He remains very motivated, good insight which continues to grow, and is less self-critical.  Has met his most immediate goals and is going to try things on his own right now, keeping goal-directed behaviors as a guide.  Reminded and encouraged patient to focus more  on positives versus negatives, also to focus more on what he can control versus cannot, continuing to take good physical care of himself including nutrition and exercise, remain in the present versus the past or too far into the future, use of positive self talk more regularly, remain involved in healthy/supportive relationships, and continue his work on goal-directed behaviors. Goal review and progress/challenges  noted with patient.  Is going to be away for overall coming up due to travel.  Will call when needed, to schedule.   [name], LCSW

## 2023-07-01 LAB — HM COLONOSCOPY

## 2023-07-04 ENCOUNTER — Encounter: Payer: Self-pay | Admitting: *Deleted

## 2023-10-05 ENCOUNTER — Telehealth (INDEPENDENT_AMBULATORY_CARE_PROVIDER_SITE_OTHER): Payer: 59 | Admitting: Family Medicine

## 2023-10-05 ENCOUNTER — Other Ambulatory Visit: Payer: Self-pay | Admitting: *Deleted

## 2023-10-05 ENCOUNTER — Encounter: Payer: Self-pay | Admitting: Family Medicine

## 2023-10-05 ENCOUNTER — Other Ambulatory Visit (INDEPENDENT_AMBULATORY_CARE_PROVIDER_SITE_OTHER): Payer: 59

## 2023-10-05 VITALS — Temp 102.0°F | Ht 70.0 in | Wt 179.0 lb

## 2023-10-05 DIAGNOSIS — R509 Fever, unspecified: Secondary | ICD-10-CM | POA: Diagnosis not present

## 2023-10-05 DIAGNOSIS — R051 Acute cough: Secondary | ICD-10-CM

## 2023-10-05 DIAGNOSIS — J029 Acute pharyngitis, unspecified: Secondary | ICD-10-CM

## 2023-10-05 DIAGNOSIS — R6883 Chills (without fever): Secondary | ICD-10-CM

## 2023-10-05 DIAGNOSIS — R52 Pain, unspecified: Secondary | ICD-10-CM

## 2023-10-05 DIAGNOSIS — J101 Influenza due to other identified influenza virus with other respiratory manifestations: Secondary | ICD-10-CM

## 2023-10-05 LAB — POCT INFLUENZA A/B
Influenza A, POC: POSITIVE — AB
Influenza B, POC: NEGATIVE

## 2023-10-05 LAB — POC COVID19 BINAXNOW: SARS Coronavirus 2 Ag: NEGATIVE

## 2023-10-05 MED ORDER — OSELTAMIVIR PHOSPHATE 75 MG PO CAPS: 75.0000 mg | ORAL_CAPSULE | Freq: Two times a day (BID) | ORAL | 0 refills | Status: AC
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
## 2023-10-05 NOTE — Patient Instructions (Signed)
Continue to use Aleve (OR ibuprofen), and tylenol, if needed for fever or pain. Stay well hydrated. Take Mucinex DM 12 hour tablet twice daily to loosen mucus/phlegm and help with the cough. You can use pseudoephedrine if needed for sinus pain/pressure or runny nose. Sinus rinses (such as Sinus Rinse kit or Neti-pot) can be helpful as well (once or twice daily).  Stay home until you are fever-free for 24 hours without having taken any fever-reducing medication, AND significantly improved respiratory symptoms. Then you can return to work with a mask on.  If you test positive for influenza, we will send in a prescription for Tamiflu. If you test positive for COVID, we discussed paxlovid, but feel that no treatment is needed. Follow up with [place] if you have persistent fever, if your cough persists or worsens, if you continue to have discolored mucus/phlegm, if you have pain with breathing, chest pain, shortness of breath, or other concerns.

## 2023-10-05 NOTE — Telephone Encounter (Signed)
Patient advised. He said he will call by the end of the day if he would like a virtual for tomorrow.

## 2024-07-05 ENCOUNTER — Other Ambulatory Visit (HOSPITAL_BASED_OUTPATIENT_CLINIC_OR_DEPARTMENT_OTHER): Payer: Self-pay | Admitting: Family Medicine

## 2024-07-05 DIAGNOSIS — Z8249 Family history of ischemic heart disease and other diseases of the circulatory system: Secondary | ICD-10-CM

## 2024-08-22 ENCOUNTER — Ambulatory Visit (HOSPITAL_BASED_OUTPATIENT_CLINIC_OR_DEPARTMENT_OTHER)
Admission: RE | Admit: 2024-08-22 | Discharge: 2024-08-22 | Disposition: A | Discharge status: Home / Self Care | Payer: Self-pay | Source: Ambulatory Visit | Attending: Family Medicine | Admitting: Family Medicine

## 2024-08-22 DIAGNOSIS — Z8249 Family history of ischemic heart disease and other diseases of the circulatory system: Secondary | ICD-10-CM

## 2024-09-03 ENCOUNTER — Telehealth: Admitting: Physician Assistant

## 2024-09-03 DIAGNOSIS — R198 Other specified symptoms and signs involving the digestive system and abdomen: Secondary | ICD-10-CM

## 2024-09-03 DIAGNOSIS — R197 Diarrhea, unspecified: Secondary | ICD-10-CM

## 2024-09-03 DIAGNOSIS — R109 Unspecified abdominal pain: Secondary | ICD-10-CM

## 2024-09-03 NOTE — Patient Instructions (Signed)
 " James Bond, thank you for joining [name], PA-C for today's virtual visit.  While this provider is not your primary care provider (PCP), if your PCP is located in our provider database this encounter information will be shared with them immediately following your visit.   A [facility] MyChart account gives you access to today's visit and all your visits, tests, and labs performed at [facility]  click here if you don't have a [facility] MyChart account or go to [email or web address]  Consent: (Patient) James Bond provided verbal consent for this virtual visit at the beginning of the encounter.  Current Medications:  Current Outpatient Medications:    naproxen  sodium (ALEVE ) 220 MG tablet, Take 220 mg by mouth., Disp: , Rfl:    oseltamivir  (TAMIFLU ) 75 MG capsule, Take 1 capsule (75 mg total) by mouth 2 (two) times daily., Disp: 10 capsule, Rfl: 0   triamcinolone  cream (KENALOG ) 0.1 %, Apply sparingly to affected areas of skin twice daily for up to 2 weeks. (Patient not taking: Reported on 10/05/2023), Disp: 45 g, Rfl: 0   valACYclovir  (VALTREX ) 1000 MG tablet, Take 2 tablets at onset of cold sore, and repeat in 12 hours (taking 4 tablets per episode of outbreak) (Patient not taking: Reported on 10/05/2023), Disp: 20 tablet, Rfl: 0   Medications ordered in this encounter:  No orders of the defined types were placed in this encounter.    *If you need refills on other medications prior to your next appointment, please contact your pharmacy*  Follow-Up: Call back or seek an in-person evaluation if the symptoms worsen or if the condition fails to improve as anticipated.  [facility] [phone]  Other Instructions Lower GI (Gastrointestinal) Bleeding: What to Know  Lower gastrointestinal (GI) bleeding is bleeding from your colon, rectum, or anus. Your colon is the last part of your digestive tract, also called your large intestine. Your  rectum is found at the end of the colon, and your anus is the opening of your butt. GI bleeding can be mild, serious, or life-threatening. Lower GI bleeding often stops on its own. You may have bloody, black, or tarry poop. What are the causes? GI bleeding may be caused by: Diverticulosis. This is when pouches form in your colon that may bleed. Inflammatory bowel disease (IBD). This is irritation and swelling of your colon. Hemorrhoids. These are swollen veins in your butt. Anal fissures. These are tears in your anus. The tears are often caused by passing hard poops. Cancer of the colon, rectum, or anus. A blood vessel that isn't normal or has become weak. A health problem that makes it hard for your body to form blood clots. This can cause you to bleed easy. What increases the risk? You're more likely to have GI bleeding if you: Are older than 48 years of age. Use aspirin or NSAIDs often. Take blood thinners. Have had radiation therapy to treat colon cancer. Have just had a growth called a colon polyp removed. Drink a lot of alcohol. Smoke. What are the signs or symptoms? Bloody, black, or tarry poop. Your poop could look bright and have blood clots or look dark red. Pain or cramps in your belly. Feeling weak or dizzy. Fast heartbeat. How is this diagnosed? You may be diagnosed based on your medical history and an exam. You may also have tests, such as: Blood tests. Colonoscopy. This is when a small, lighted tube is used to look at your upper  colon. Flexible sigmoidoscopy. This is when a lighted tube is used to look at your lower colon, rectum, and anus. Angiogram. This may be done if the source of bleeding isn't found with a colonoscopy. With this, X-rays are taken after a dye is put into your blood. How is this treated? Treatment depends on the cause of the bleeding. You may: Get fluids through an IV put into one of your veins. Get blood through an IV. This is called a blood  transfusion. Have an colonoscopy to treat an area of bleeding with high heat, shots, or surgical clips. Need to stop or change some of your regular medicines for a certain amount of time. Need surgery if other treatments don't work. If you have a lot of bleeding, you may need to stay in the hospital. Follow these instructions at home: Eating and drinking Try to eat foods that have a lot of fiber in them. This will help to keep your poops soft. These may include: Beans. Whole grains. Fresh fruits and vegetables. Drink more fluids as told. Do not drink alcohol. General instructions Do not smoke, vape, or use nicotine or tobacco. Take your medicines only as told. You may need to stop taking aspirin, NSAIDs, or other medicines that may make you bleed more. Ask what things are safe for you to do at home. Ask when you can go back to work or school. Keep all follow-up visits. Your provider may check your blood tests again to make sure the bleeding has stopped. Contact a health care provider if: Your symptoms don't get better. Your symptoms get worse. You feel like throwing up. You throw up. You have pain in your belly. You feel weak or dizzy. Get help right away if: You have more bleeding, or you pass blood clots in your poop. You have sudden or very bad cramps in your back or belly. You throw up blood. You faint. You have chest pain. You have fast heartbeats. These symptoms may be an emergency. Call 911 right away. Do not wait to see if the symptoms will go away. Do not drive yourself to the hospital. This information is not intended to replace advice given to you by your health care provider. Make sure you discuss any questions you have with your health care provider. Document Revised: 11/13/2023 Document Reviewed: 11/13/2023 Elsevier Patient Education  [employer].   If you have been instructed to have an in-person evaluation today at a local Urgent Care facility, please  use the link below. It will take you to a list of all of our available [facility], including address, phone number and hours of operation. Please do not delay care.  [facility]  If you or a family member do not have a primary care provider, use the link below to schedule a visit and establish care. When you choose a [facility] primary care physician or advanced practice provider, you gain a long-term partner in health. Find a Primary Care Provider  Learn more about [facility]'s in-office and virtual care options: [facility] - Get Care Now "

## 2024-09-03 NOTE — Progress Notes (Signed)
 " Virtual Visit Consent   [name], you are scheduled for a virtual visit with a [facility] provider today. Just as with appointments in the office, your consent must be obtained to participate. Your consent will be active for this visit and any virtual visit you may have with one of our providers in the next 365 days. If you have a MyChart account, a copy of this consent can be sent to you electronically.  As this is a virtual visit, video technology does not allow for your provider to perform a traditional examination. This may limit your provider's ability to fully assess your condition. If your provider identifies any concerns that need to be evaluated in person or the need to arrange testing (such as labs, EKG, etc.), we will make arrangements to do so. Although advances in technology are sophisticated, we cannot ensure that it will always work on either your end or our end. If the connection with a video visit is poor, the visit may have to be switched to a telephone visit. With either a video or telephone visit, we are not always able to ensure that we have a secure connection.  By engaging in this virtual visit, you consent to the provision of healthcare and authorize for your insurance to be billed (if applicable) for the services provided during this visit. Depending on your insurance coverage, you may receive a charge related to this service.  I need to obtain your verbal consent now. Are you willing to proceed with your visit today? James Bond has provided verbal consent on 09/03/2024 for a virtual visit (video or telephone). James CHRISTELLA Dickinson, PA-C  Date: 09/03/2024 4:48 PM   Virtual Visit via Video Note   I, James Bond, connected with  James Bond  ([id number], December 27, 1975) on 09/03/2024 at  4:30 PM EST by a video-enabled telemedicine application and verified that I am speaking with the correct person using two identifiers.  Location: Patient: Virtual Visit  Location Patient: Home Provider: Virtual Visit Location Provider: Home Office   I discussed the limitations of evaluation and management by telemedicine and the availability of in person appointments. The patient expressed understanding and agreed to proceed.    History of Present Illness: [name] is a 48 y.o. who identifies as a male who was assigned male at birth, and is being seen today for diarrhea, cramping, bloody diarrhea.  HPI: Diarrhea  This is a new problem. Episode onset: woke up around 2am with stomach cramps and cold sweats. The problem occurs 5 to 10 times per day. The problem has been unchanged. The stool consistency is described as Watery and bloody (was stool mixed with BRB, now reports only BRB-reports 2 teaspoons or less he feels). The patient states that diarrhea awakens him from sleep. Associated symptoms include abdominal pain, bloating, chills and sweats. Pertinent negatives include no vomiting. Associated symptoms comments: tenesmus. He has tried nothing for the symptoms. The treatment provided no relief.  Did have Colonoscopy in 2024 with only 1 polyp removed, to f/u in 7 years.   Problems:  Patient Active Problem List   Diagnosis Date Noted   Cervical disc disorder with radiculopathy of cervical region 06/12/2017   Right shoulder pain 06/12/2017   Right wrist pain 03/22/2012    Allergies: Allergies[1] Medications: Current Medications[2]  Observations/Objective: Patient is well-developed, well-nourished in no acute distress.  Resting comfortably at home.  Head is normocephalic, atraumatic.  No labored breathing.  Speech is clear  and coherent with logical content.  Patient is alert and oriented at baseline.    Assessment and Plan: 1. Bloody diarrhea (Primary)  2. Tenesmus  3. Abdominal cramping  - Due to now only having BRB that is associated with cramping prior to attempting to have a BM and having associated tenesmus, I have advised him to seek  in person evaluation. I advised for ER evaluation, but patient politely declines and that was when he mentioned the amount of blood with each BM attempt. Being that he declined ER, I advised to be seen at a local UC, but that he may still be deferred to the ER for work up.   Follow Up Instructions: I discussed the assessment and treatment plan with the patient. The patient was provided an opportunity to ask questions and all were answered. The patient agreed with the plan and demonstrated an understanding of the instructions.  A copy of instructions were sent to the patient via MyChart unless otherwise noted below.    The patient was advised to call back or seek an in-person evaluation if the symptoms worsen or if the condition fails to improve as anticipated.    James [name], PA-C     [1] No Known Allergies [2]  Current Outpatient Medications:    naproxen  sodium (ALEVE ) 220 MG tablet, Take 220 mg by mouth., Disp: , Rfl:    oseltamivir  (TAMIFLU ) 75 MG capsule, Take 1 capsule (75 mg total) by mouth 2 (two) times daily., Disp: 10 capsule, Rfl: 0   triamcinolone  cream (KENALOG ) 0.1 %, Apply sparingly to affected areas of skin twice daily for up to 2 weeks. (Patient not taking: Reported on 10/05/2023), Disp: 45 g, Rfl: 0   valACYclovir  (VALTREX ) 1000 MG tablet, Take 2 tablets at onset of cold sore, and repeat in 12 hours (taking 4 tablets per episode of outbreak) (Patient not taking: Reported on 10/05/2023), Disp: 20 tablet, Rfl: 0  "

## 2024-09-04 ENCOUNTER — Ambulatory Visit (HOSPITAL_COMMUNITY)

## 2024-09-04 ENCOUNTER — Encounter: Payer: Self-pay | Admitting: Family Medicine

## 2024-09-23 ENCOUNTER — Encounter: Payer: Self-pay | Admitting: Family Medicine
